# Patient Record
Sex: Female | Born: 1940 | Race: White | Hispanic: No | Smoking: Never smoker
Health system: Southern US, Community
[De-identification: ages and names within clinical notes are randomized; demographics above are authoritative.]

## PROBLEM LIST (undated history)

## (undated) DIAGNOSIS — R7303 Prediabetes: Secondary | ICD-10-CM

## (undated) DIAGNOSIS — C50919 Malignant neoplasm of unspecified site of unspecified female breast: Secondary | ICD-10-CM

## (undated) DIAGNOSIS — G709 Myoneural disorder, unspecified: Secondary | ICD-10-CM

## (undated) DIAGNOSIS — Z87442 Personal history of urinary calculi: Secondary | ICD-10-CM

## (undated) DIAGNOSIS — R112 Nausea with vomiting, unspecified: Secondary | ICD-10-CM

## (undated) DIAGNOSIS — T4145XA Adverse effect of unspecified anesthetic, initial encounter: Secondary | ICD-10-CM

## (undated) DIAGNOSIS — Z9889 Other specified postprocedural states: Secondary | ICD-10-CM

## (undated) DIAGNOSIS — C07 Malignant neoplasm of parotid gland: Secondary | ICD-10-CM

## (undated) DIAGNOSIS — N189 Chronic kidney disease, unspecified: Secondary | ICD-10-CM

## (undated) DIAGNOSIS — I10 Essential (primary) hypertension: Secondary | ICD-10-CM

## (undated) DIAGNOSIS — M1990 Unspecified osteoarthritis, unspecified site: Secondary | ICD-10-CM

## (undated) DIAGNOSIS — T8859XA Other complications of anesthesia, initial encounter: Secondary | ICD-10-CM

## (undated) HISTORY — PX: VESICOVAGINAL FISTULA CLOSURE W/ TAH: SUR271

## (undated) HISTORY — PX: TONSILLECTOMY: SUR1361

## (undated) HISTORY — DX: Malignant neoplasm of unspecified site of unspecified female breast: C50.919

## (undated) HISTORY — PX: OTHER SURGICAL HISTORY: SHX169

## (undated) HISTORY — PX: EYE SURGERY: SHX253

## (undated) HISTORY — DX: Essential (primary) hypertension: I10

---

## 1985-02-25 HISTORY — PX: DILATION AND CURETTAGE OF UTERUS: SHX78

## 1986-02-25 HISTORY — PX: BREAST LUMPECTOMY: SHX2

## 1989-02-25 HISTORY — PX: ABDOMINAL HYSTERECTOMY: SHX81

## 1996-02-26 HISTORY — PX: BREAST LUMPECTOMY: SHX2

## 1999-03-06 ENCOUNTER — Encounter: Payer: Self-pay | Admitting: Hematology and Oncology

## 1999-03-06 ENCOUNTER — Encounter: Admission: RE | Admit: 1999-03-06 | Discharge: 1999-03-06 | Payer: Self-pay | Admitting: Hematology and Oncology

## 2000-02-26 HISTORY — PX: MASTECTOMY: SHX3

## 2000-08-19 ENCOUNTER — Encounter: Payer: Self-pay | Admitting: General Surgery

## 2000-08-19 ENCOUNTER — Other Ambulatory Visit: Admission: RE | Admit: 2000-08-19 | Discharge: 2000-08-19 | Payer: Self-pay | Admitting: Internal Medicine

## 2000-08-19 ENCOUNTER — Encounter: Admission: RE | Admit: 2000-08-19 | Discharge: 2000-08-19 | Payer: Self-pay | Admitting: General Surgery

## 2000-08-20 ENCOUNTER — Other Ambulatory Visit: Admission: RE | Admit: 2000-08-20 | Discharge: 2000-08-20 | Payer: Self-pay | Admitting: General Surgery

## 2000-08-20 ENCOUNTER — Encounter (INDEPENDENT_AMBULATORY_CARE_PROVIDER_SITE_OTHER): Payer: Self-pay | Admitting: *Deleted

## 2000-08-20 ENCOUNTER — Encounter: Admission: RE | Admit: 2000-08-20 | Discharge: 2000-08-20 | Payer: Self-pay | Admitting: General Surgery

## 2000-08-20 ENCOUNTER — Encounter: Payer: Self-pay | Admitting: General Surgery

## 2000-09-15 ENCOUNTER — Encounter: Payer: Self-pay | Admitting: General Surgery

## 2000-09-17 ENCOUNTER — Inpatient Hospital Stay (HOSPITAL_COMMUNITY): Admission: RE | Admit: 2000-09-17 | Discharge: 2000-09-19 | Payer: Self-pay | Admitting: General Surgery

## 2000-09-17 ENCOUNTER — Encounter (INDEPENDENT_AMBULATORY_CARE_PROVIDER_SITE_OTHER): Payer: Self-pay | Admitting: *Deleted

## 2000-09-29 ENCOUNTER — Encounter: Payer: Self-pay | Admitting: Hematology and Oncology

## 2000-09-29 ENCOUNTER — Ambulatory Visit (HOSPITAL_COMMUNITY): Admission: RE | Admit: 2000-09-29 | Discharge: 2000-09-29 | Payer: Self-pay | Admitting: Hematology and Oncology

## 2000-10-29 ENCOUNTER — Ambulatory Visit (HOSPITAL_BASED_OUTPATIENT_CLINIC_OR_DEPARTMENT_OTHER): Admission: RE | Admit: 2000-10-29 | Discharge: 2000-10-29 | Payer: Self-pay | Admitting: General Surgery

## 2000-10-29 ENCOUNTER — Encounter: Payer: Self-pay | Admitting: General Surgery

## 2001-02-25 HISTORY — PX: MASTECTOMY: SHX3

## 2001-02-25 HISTORY — PX: HERNIA REPAIR: SHX51

## 2001-02-25 HISTORY — PX: BREAST RECONSTRUCTION: SHX9

## 2001-03-27 ENCOUNTER — Ambulatory Visit (HOSPITAL_BASED_OUTPATIENT_CLINIC_OR_DEPARTMENT_OTHER): Admission: RE | Admit: 2001-03-27 | Discharge: 2001-03-27 | Payer: Self-pay | Admitting: General Surgery

## 2001-04-01 ENCOUNTER — Ambulatory Visit: Admission: RE | Admit: 2001-04-01 | Discharge: 2001-06-30 | Payer: Self-pay | Admitting: Radiation Oncology

## 2001-06-22 ENCOUNTER — Encounter: Payer: Self-pay | Admitting: General Surgery

## 2001-06-22 ENCOUNTER — Encounter: Admission: RE | Admit: 2001-06-22 | Discharge: 2001-06-22 | Payer: Self-pay | Admitting: General Surgery

## 2001-07-10 ENCOUNTER — Encounter: Payer: Self-pay | Admitting: Plastic Surgery

## 2001-07-10 ENCOUNTER — Encounter (INDEPENDENT_AMBULATORY_CARE_PROVIDER_SITE_OTHER): Payer: Self-pay | Admitting: Specialist

## 2001-07-10 ENCOUNTER — Inpatient Hospital Stay (HOSPITAL_COMMUNITY): Admission: RE | Admit: 2001-07-10 | Discharge: 2001-07-13 | Payer: Self-pay | Admitting: Plastic Surgery

## 2001-10-30 ENCOUNTER — Ambulatory Visit (HOSPITAL_COMMUNITY): Admission: RE | Admit: 2001-10-30 | Discharge: 2001-10-30 | Payer: Self-pay | Admitting: *Deleted

## 2001-10-30 ENCOUNTER — Encounter: Payer: Self-pay | Admitting: *Deleted

## 2002-06-28 ENCOUNTER — Encounter: Admission: RE | Admit: 2002-06-28 | Discharge: 2002-06-28 | Payer: Self-pay | Admitting: *Deleted

## 2002-06-28 ENCOUNTER — Encounter: Payer: Self-pay | Admitting: *Deleted

## 2003-02-26 HISTORY — PX: BREAST LUMPECTOMY: SHX2

## 2003-07-14 ENCOUNTER — Encounter: Admission: RE | Admit: 2003-07-14 | Discharge: 2003-07-14 | Payer: Self-pay | Admitting: Oncology

## 2003-08-11 ENCOUNTER — Encounter: Admission: RE | Admit: 2003-08-11 | Discharge: 2003-08-11 | Payer: Self-pay | Admitting: Oncology

## 2003-08-11 ENCOUNTER — Encounter (INDEPENDENT_AMBULATORY_CARE_PROVIDER_SITE_OTHER): Payer: Self-pay | Admitting: *Deleted

## 2003-09-06 ENCOUNTER — Ambulatory Visit (HOSPITAL_BASED_OUTPATIENT_CLINIC_OR_DEPARTMENT_OTHER): Admission: RE | Admit: 2003-09-06 | Discharge: 2003-09-06 | Payer: Self-pay | Admitting: General Surgery

## 2003-09-06 ENCOUNTER — Ambulatory Visit (HOSPITAL_COMMUNITY): Admission: RE | Admit: 2003-09-06 | Discharge: 2003-09-06 | Payer: Self-pay | Admitting: General Surgery

## 2003-09-06 ENCOUNTER — Encounter (INDEPENDENT_AMBULATORY_CARE_PROVIDER_SITE_OTHER): Payer: Self-pay | Admitting: Specialist

## 2003-09-06 ENCOUNTER — Encounter: Admission: RE | Admit: 2003-09-06 | Discharge: 2003-09-06 | Payer: Self-pay | Admitting: General Surgery

## 2003-09-21 ENCOUNTER — Ambulatory Visit: Admission: RE | Admit: 2003-09-21 | Discharge: 2003-12-19 | Payer: Self-pay | Admitting: Radiation Oncology

## 2003-09-30 ENCOUNTER — Ambulatory Visit (HOSPITAL_COMMUNITY): Admission: RE | Admit: 2003-09-30 | Discharge: 2003-09-30 | Payer: Self-pay | Admitting: Oncology

## 2003-10-07 ENCOUNTER — Encounter: Admission: RE | Admit: 2003-10-07 | Discharge: 2003-10-07 | Payer: Self-pay | Admitting: Oncology

## 2003-10-14 ENCOUNTER — Encounter (HOSPITAL_COMMUNITY): Admission: RE | Admit: 2003-10-14 | Discharge: 2003-11-01 | Payer: Self-pay | Admitting: Oncology

## 2004-01-12 ENCOUNTER — Ambulatory Visit: Admission: RE | Admit: 2004-01-12 | Discharge: 2004-01-12 | Payer: Self-pay | Admitting: Radiation Oncology

## 2004-01-31 ENCOUNTER — Ambulatory Visit: Payer: Self-pay | Admitting: Oncology

## 2004-02-16 ENCOUNTER — Encounter: Admission: RE | Admit: 2004-02-16 | Discharge: 2004-02-23 | Payer: Self-pay | Admitting: Oncology

## 2004-08-14 ENCOUNTER — Encounter: Admission: RE | Admit: 2004-08-14 | Discharge: 2004-08-14 | Payer: Self-pay | Admitting: General Surgery

## 2004-08-21 ENCOUNTER — Ambulatory Visit: Payer: Self-pay | Admitting: Oncology

## 2004-11-02 ENCOUNTER — Encounter (INDEPENDENT_AMBULATORY_CARE_PROVIDER_SITE_OTHER): Payer: Self-pay | Admitting: Specialist

## 2004-11-02 ENCOUNTER — Ambulatory Visit (HOSPITAL_COMMUNITY): Admission: RE | Admit: 2004-11-02 | Discharge: 2004-11-02 | Payer: Self-pay | Admitting: Gastroenterology

## 2005-02-19 ENCOUNTER — Ambulatory Visit: Payer: Self-pay | Admitting: Oncology

## 2005-06-11 ENCOUNTER — Ambulatory Visit: Payer: Self-pay | Admitting: Oncology

## 2005-08-15 ENCOUNTER — Encounter: Admission: RE | Admit: 2005-08-15 | Discharge: 2005-08-15 | Payer: Self-pay | Admitting: Oncology

## 2005-08-26 ENCOUNTER — Encounter: Admission: RE | Admit: 2005-08-26 | Discharge: 2005-08-26 | Payer: Self-pay | Admitting: Oncology

## 2005-11-26 ENCOUNTER — Ambulatory Visit: Payer: Self-pay | Admitting: Oncology

## 2006-05-26 ENCOUNTER — Ambulatory Visit: Payer: Self-pay | Admitting: Oncology

## 2006-05-28 LAB — CBC WITH DIFFERENTIAL/PLATELET
Basophils Absolute: 0 10*3/uL (ref 0.0–0.1)
EOS%: 1.6 % (ref 0.0–7.0)
Eosinophils Absolute: 0.1 10*3/uL (ref 0.0–0.5)
HGB: 13.4 g/dL (ref 11.6–15.9)
LYMPH%: 24.5 % (ref 14.0–48.0)
MCH: 31.2 pg (ref 26.0–34.0)
MCV: 88.6 fL (ref 81.0–101.0)
MONO%: 5.7 % (ref 0.0–13.0)
NEUT#: 5.2 10*3/uL (ref 1.5–6.5)
Platelets: 337 10*3/uL (ref 145–400)
RBC: 4.28 10*6/uL (ref 3.70–5.32)
RDW: 10.9 % — ABNORMAL LOW (ref 11.3–14.5)

## 2006-05-28 LAB — COMPREHENSIVE METABOLIC PANEL
AST: 21 U/L (ref 0–37)
Alkaline Phosphatase: 91 U/L (ref 39–117)
BUN: 17 mg/dL (ref 6–23)
Glucose, Bld: 123 mg/dL — ABNORMAL HIGH (ref 70–99)
Total Bilirubin: 0.4 mg/dL (ref 0.3–1.2)

## 2006-08-18 ENCOUNTER — Encounter: Admission: RE | Admit: 2006-08-18 | Discharge: 2006-08-18 | Payer: Self-pay | Admitting: Oncology

## 2006-12-17 ENCOUNTER — Ambulatory Visit: Payer: Self-pay | Admitting: Oncology

## 2006-12-19 LAB — CBC WITH DIFFERENTIAL/PLATELET
Basophils Absolute: 0 10*3/uL (ref 0.0–0.1)
Eosinophils Absolute: 0.3 10*3/uL (ref 0.0–0.5)
HCT: 38.5 % (ref 34.8–46.6)
HGB: 13.6 g/dL (ref 11.6–15.9)
LYMPH%: 21.2 % (ref 14.0–48.0)
MCH: 31.2 pg (ref 26.0–34.0)
MCV: 88.5 fL (ref 81.0–101.0)
MONO#: 0.7 10*3/uL (ref 0.1–0.9)
MONO%: 6.5 % (ref 0.0–13.0)
NEUT#: 7 10*3/uL — ABNORMAL HIGH (ref 1.5–6.5)
NEUT%: 69.2 % (ref 39.6–76.8)
Platelets: 295 10*3/uL (ref 145–400)
RDW: 12.5 % (ref 11.3–14.5)

## 2006-12-19 LAB — COMPREHENSIVE METABOLIC PANEL
BUN: 16 mg/dL (ref 6–23)
CO2: 25 mEq/L (ref 19–32)
Calcium: 8.8 mg/dL (ref 8.4–10.5)
Chloride: 102 mEq/L (ref 96–112)
Creatinine, Ser: 0.82 mg/dL (ref 0.40–1.20)

## 2007-06-19 ENCOUNTER — Ambulatory Visit: Payer: Self-pay | Admitting: Oncology

## 2007-06-23 LAB — CBC WITH DIFFERENTIAL/PLATELET
EOS%: 1.6 % (ref 0.0–7.0)
LYMPH%: 28.4 % (ref 14.0–48.0)
MCH: 30.8 pg (ref 26.0–34.0)
MCHC: 34.7 g/dL (ref 32.0–36.0)
MCV: 88.8 fL (ref 81.0–101.0)
MONO%: 6.3 % (ref 0.0–13.0)
Platelets: 287 10*3/uL (ref 145–400)
RBC: 4.4 10*6/uL (ref 3.70–5.32)
RDW: 12.4 % (ref 11.3–14.5)

## 2007-06-23 LAB — COMPREHENSIVE METABOLIC PANEL
AST: 20 U/L (ref 0–37)
Albumin: 4.1 g/dL (ref 3.5–5.2)
Alkaline Phosphatase: 94 U/L (ref 39–117)
Glucose, Bld: 96 mg/dL (ref 70–99)
Potassium: 3.9 mEq/L (ref 3.5–5.3)
Sodium: 140 mEq/L (ref 135–145)
Total Bilirubin: 0.5 mg/dL (ref 0.3–1.2)
Total Protein: 7.3 g/dL (ref 6.0–8.3)

## 2007-08-20 ENCOUNTER — Encounter: Admission: RE | Admit: 2007-08-20 | Discharge: 2007-08-20 | Payer: Self-pay | Admitting: Oncology

## 2007-12-21 ENCOUNTER — Ambulatory Visit: Payer: Self-pay | Admitting: Oncology

## 2007-12-23 LAB — COMPREHENSIVE METABOLIC PANEL
ALT: 23 U/L (ref 0–35)
AST: 25 U/L (ref 0–37)
Albumin: 4.1 g/dL (ref 3.5–5.2)
Alkaline Phosphatase: 99 U/L (ref 39–117)
Glucose, Bld: 103 mg/dL — ABNORMAL HIGH (ref 70–99)
Potassium: 3.7 mEq/L (ref 3.5–5.3)
Sodium: 141 mEq/L (ref 135–145)
Total Bilirubin: 0.7 mg/dL (ref 0.3–1.2)
Total Protein: 7.1 g/dL (ref 6.0–8.3)

## 2007-12-23 LAB — CBC WITH DIFFERENTIAL/PLATELET
Basophils Absolute: 0 10*3/uL (ref 0.0–0.1)
EOS%: 1.8 % (ref 0.0–7.0)
HCT: 39.8 % (ref 34.8–46.6)
HGB: 13.7 g/dL (ref 11.6–15.9)
LYMPH%: 25.8 % (ref 14.0–48.0)
MCH: 30.8 pg (ref 26.0–34.0)
MCV: 89.8 fL (ref 81.0–101.0)
MONO%: 4.8 % (ref 0.0–13.0)
NEUT%: 67.3 % (ref 39.6–76.8)
Platelets: 265 10*3/uL (ref 145–400)
RDW: 12.6 % (ref 11.3–14.5)

## 2008-08-03 ENCOUNTER — Ambulatory Visit: Payer: Self-pay | Admitting: Oncology

## 2008-08-22 ENCOUNTER — Encounter: Admission: RE | Admit: 2008-08-22 | Discharge: 2008-08-22 | Payer: Self-pay | Admitting: Oncology

## 2009-02-01 ENCOUNTER — Ambulatory Visit: Payer: Self-pay | Admitting: Oncology

## 2009-02-03 LAB — CBC WITH DIFFERENTIAL/PLATELET
BASO%: 0.2 % (ref 0.0–2.0)
Eosinophils Absolute: 0.1 10*3/uL (ref 0.0–0.5)
HCT: 39 % (ref 34.8–46.6)
HGB: 13.4 g/dL (ref 11.6–15.9)
LYMPH%: 27.9 % (ref 14.0–49.7)
MCHC: 34.3 g/dL (ref 31.5–36.0)
MONO#: 0.4 10*3/uL (ref 0.1–0.9)
NEUT#: 5.4 10*3/uL (ref 1.5–6.5)
NEUT%: 65.3 % (ref 38.4–76.8)
Platelets: 287 10*3/uL (ref 145–400)
WBC: 8.3 10*3/uL (ref 3.9–10.3)
lymph#: 2.3 10*3/uL (ref 0.9–3.3)

## 2009-02-03 LAB — COMPREHENSIVE METABOLIC PANEL
ALT: 19 U/L (ref 0–35)
CO2: 22 mEq/L (ref 19–32)
Calcium: 9.1 mg/dL (ref 8.4–10.5)
Chloride: 104 mEq/L (ref 96–112)
Creatinine, Ser: 0.8 mg/dL (ref 0.40–1.20)
Glucose, Bld: 117 mg/dL — ABNORMAL HIGH (ref 70–99)
Sodium: 141 mEq/L (ref 135–145)
Total Bilirubin: 0.5 mg/dL (ref 0.3–1.2)
Total Protein: 7.3 g/dL (ref 6.0–8.3)

## 2009-09-21 ENCOUNTER — Encounter: Admission: RE | Admit: 2009-09-21 | Discharge: 2009-09-21 | Payer: Self-pay | Admitting: Oncology

## 2010-01-31 ENCOUNTER — Ambulatory Visit: Payer: Self-pay | Admitting: Oncology

## 2010-03-17 ENCOUNTER — Other Ambulatory Visit: Payer: Self-pay | Admitting: Oncology

## 2010-03-17 DIAGNOSIS — Z9889 Other specified postprocedural states: Secondary | ICD-10-CM

## 2010-03-17 DIAGNOSIS — Z9012 Acquired absence of left breast and nipple: Secondary | ICD-10-CM

## 2010-03-18 ENCOUNTER — Encounter: Payer: Self-pay | Admitting: Surgery

## 2010-03-19 ENCOUNTER — Encounter: Payer: Self-pay | Admitting: Oncology

## 2010-06-06 ENCOUNTER — Other Ambulatory Visit: Payer: Self-pay | Admitting: Orthopedic Surgery

## 2010-06-06 ENCOUNTER — Ambulatory Visit
Admission: RE | Admit: 2010-06-06 | Discharge: 2010-06-06 | Disposition: A | Discharge status: Home / Self Care | Payer: Medicare Other | Source: Ambulatory Visit | Attending: Orthopedic Surgery | Admitting: Orthopedic Surgery

## 2010-06-07 ENCOUNTER — Ambulatory Visit (INDEPENDENT_AMBULATORY_CARE_PROVIDER_SITE_OTHER): Payer: Medicare Other | Admitting: Internal Medicine

## 2010-06-07 ENCOUNTER — Encounter: Payer: Self-pay | Admitting: Internal Medicine

## 2010-06-07 DIAGNOSIS — J9383 Other pneumothorax: Secondary | ICD-10-CM

## 2010-06-07 DIAGNOSIS — J939 Pneumothorax, unspecified: Secondary | ICD-10-CM

## 2010-06-07 DIAGNOSIS — I10 Essential (primary) hypertension: Secondary | ICD-10-CM

## 2010-06-07 NOTE — Assessment & Plan Note (Signed)
Discussed in detail all the  indications, usual  risks and alternatives  relative to the benefits with patient who agrees to proceed with conservative f/u in one week with cxr and clear for cxr if no sign ptx  - the assoc small effusion is undoubtedly small hemothorax so unless develops a post cardiac injury syncrome (Dressler's like) should do fine without intervention
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
## 2010-06-07 NOTE — Patient Instructions (Addendum)
Return for f/u cxr on Wednesday 06/13/10 - call sooner if get worse or go to ER

## 2010-06-12 ENCOUNTER — Encounter: Payer: Self-pay | Admitting: Internal Medicine

## 2010-06-13 ENCOUNTER — Ambulatory Visit (INDEPENDENT_AMBULATORY_CARE_PROVIDER_SITE_OTHER): Payer: Medicare Other | Admitting: Internal Medicine

## 2010-06-13 ENCOUNTER — Ambulatory Visit (INDEPENDENT_AMBULATORY_CARE_PROVIDER_SITE_OTHER)
Admission: RE | Admit: 2010-06-13 | Discharge: 2010-06-13 | Disposition: A | Discharge status: Home / Self Care | Payer: Medicare Other | Source: Ambulatory Visit | Attending: Internal Medicine | Admitting: Internal Medicine

## 2010-06-13 ENCOUNTER — Encounter: Payer: Self-pay | Admitting: Internal Medicine

## 2010-06-13 ENCOUNTER — Telehealth: Payer: Self-pay | Admitting: Internal Medicine

## 2010-06-13 VITALS — BP 116/72 | HR 75 | Temp 97.9°F | Ht 66.0 in | Wt 216.4 lb

## 2010-06-13 DIAGNOSIS — J939 Pneumothorax, unspecified: Secondary | ICD-10-CM

## 2010-06-13 DIAGNOSIS — J9383 Other pneumothorax: Secondary | ICD-10-CM

## 2010-06-13 NOTE — Telephone Encounter (Signed)
Faxed OV & CXR to [facility] ([phone]).

## 2010-06-13 NOTE — Telephone Encounter (Signed)
Will forward to Dr. Sherene Sires since pt saw him today at 12:00 as new consult. Please advise Dr. Sherene Sires. Thanks

## 2010-06-13 NOTE — Patient Instructions (Signed)
You are cleared for surgery from a pulmonary perspective and we are available if needed  Anesthesia should be aware of the recent lung injury so as to avoid high airway pressures.
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
## 2010-06-13 NOTE — Assessment & Plan Note (Signed)
    -   CXR 06/13/2010 > resolved ptx, very small effusion  She has a very small residual L effusion of no consequence likley old blood but no longer a contraindication to surgery as long as use low pressures if requires intubation for L hand surgery  Notified Dr Amanda Pea

## 2010-06-25 ENCOUNTER — Encounter (INDEPENDENT_AMBULATORY_CARE_PROVIDER_SITE_OTHER): Payer: Self-pay | Admitting: General Surgery

## 2010-07-13 NOTE — H&P (Signed)
[place]  Patient:    Jenna Jordan, Jenna Jordan                          MRN: [id number] Proc. Date: 09/17/00 Adm. Date:  09/17/00 Attending:  Rose Phi. Maple Hudson, M.D.                         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  She received chemotherapy and postop radiation therapy and has done well in followup since then until she had a recent routine mammogram which showed a new mass, spiculated, at the 12 oclock position.  Needle core biopsy showed an invasive carcinoma.  She is now scheduled for a completion left mastectomy and a delayed TRAM reconstruction.  PAST SURGICAL HISTORY:  Previous surgery consists primarily of the surgery for her breast cancer as noted above in 1989.  ALLERGIES:  She is allergic to DEMEROL and PENICILLIN.  FAMILY HISTORY:  There is no family history of breast cancer.  REVIEW OF SYSTEMS:  Review of systems showed no significant cardiorespiratory, gastrointestinal or genitourinary problems except as noted above.  PHYSICAL FINDINGS:  VITAL SIGNS:  Blood pressure 156/80, pulse 80, respirations 16.  HEENT:  Within normal limits.  NECK:  Supple without masses.  There was no palpable adenopathy.  BREASTS:  The right breast was totally normal.  The left breast had a little thickened area at the 12 oclock position.  No palpable adenopathy.  HEART:  Normal sinus rhythm without murmurs.  LUNGS:  Clear to auscultation.  ABDOMEN:  Soft without masses, tenderness or organomegaly.  Bowel sounds were active.  There were no herniae.  PELVIC AND RECTAL:  Exams have been previously done.  EXTREMITIES:  No abnormalities.  IMPRESSION:  Recurrent or new carcinoma of the left breast. DD:  09/17/00 TD:  09/17/00 Job:  [id number]

## 2010-07-13 NOTE — Op Note (Signed)
NAME:  Jenna Jordan, Jenna Jordan                           ACCOUNT NO.:  [id number]   MEDICAL RECORD NO.:  [id number]                   PATIENT TYPE:  AMB   LOCATION:  DSC                                  FACILITY:  [facility]   PHYSICIAN:  Rose Phi. Maple Hudson, M.D.                DATE OF BIRTH:  [birth year]   DATE OF PROCEDURE:  09/06/2003  DATE OF DISCHARGE:                                 OPERATIVE REPORT   PREOPERATIVE DIAGNOSIS:  Stage I carcinoma of the right breast.   POSTOPERATIVE DIAGNOSIS:  Stage I carcinoma of the right breast.   OPERATION:  1. Blue dye injection.  2. Right partial mastectomy with needle localization and specimen mammogram.  3. Right sentinel lymph node biopsy.   SURGEON:  Rose Phi. Maple Hudson, M.D.   ANESTHESIA:  General.   OPERATIVE PROCEDURE:  Prior to coming to the operating room, a localizing  wire had been placed with the lesion at about the 12 o'clock position of the  right breast.  In addition, 1 millicurie of technetium sulfur colloid was  injected intradermally.   After suitable general anesthesia was induced, the patient was placed in a  supine position with the arms extended on the arm board.  A mixture of 2 mL  of methylene blue and 3 mL of injectable saline were then injected in the  subareolar tissue and the breast gently massaged for about three minutes.  After prepping and draping, a curved incision centered on the lesion at the  12 o'clock position was then made and we delivered the wire into the  incision and then widely excised the wire and surrounding tissue.  The  specimen was oriented for the pathologist.  Specimen mammogram confirmed the  removal of the lesion and touch preps on the margin were negative.   While that was being done, a short transverse right axillary incision was  made with dissection through the subcutaneous tissue to the clavipectoral  fascia.  There was one clear blue hot lymph node which I excised.  There  were no other palpable blue  or hot nodes.  That lymph node was submitted as  a sentinel node and it turned out to be negative for metastatic disease.  We  injected 0.25% Marcaine in both incisions and then closed them in two layers  with 3-0 Vicryl and subcuticular 4-0 Monocryl and Steri-Strips.   After receiving the reports from the pathologist, dressings were applied and  the patient transferred to the recovery room in satisfactory condition,  having tolerated the procedure well.                                               Rose Phi. Maple Hudson, M.D.    PRY/MEDQ  D:  09/06/2003  T:  09/06/2003  Job:  [id number]

## 2010-07-13 NOTE — H&P (Signed)
[place]. [facility]  Patient:    Jenna Jordan, Jenna Jordan Visit Number: [id number] MRN: [id number]          Service Type: SUR Location: [id number] Attending Physician:  Chapman Moss Dictated by:   Teena Irani. Odis Luster, M.D. Admit Date:  07/10/2001                           History and Physical  CHIEF COMPLAINT:  Left breast cancer with surgical absence of left breast.  HISTORY OF PRESENT ILLNESS:  [age]-year-old woman had breast cancer back in 1988.  She underwent lumpectomy and radiation therapy.  This failed last year; she had a recurrent breast cancer.  She underwent a total mastectomy and had a delay of a TRAM flap at that time, since she was very interested in breast reconstruction.  Autogenous tissue was preferred, since she had had previous radiation.  She now presents for the transfer of the TRAM flap and breast reconstruction.  PAST MEDICAL HISTORY:  Negative for cardiac, renal, GI, pulmonary disease, diabetes mellitus or hypertension.  REVIEW OF SYSTEMS:  Entirely negative.  SOCIAL HISTORY:  She lives locally.  She works as a Engineer, civil (consulting).  She is a nonsmoker.  PHYSICAL EXAMINATION:  GENERAL:  Slightly obese woman in no acute distress, alert and cooperative.  HEENT:  PERL.  EOMI.  NECK:  Supple without adenopathy or mass.  LUNGS:  Clear to auscultation.  Breath sounds equally bilaterally.  HEART:  Regular rate and rhythm without murmur.  S1 and S2 within normal limits.  BREASTS:  Right breast without mass.  There is no axillary adenopathy on either side.  Left chest scar is well-healed and there is no evidence of any tenderness or mass.  ABDOMEN:  Slightly obese.  Well-healed delayed scar in the lower abdomen oriented transversely.  EXTREMITIES:  Full range of motion and equal strength bilaterally.  NEUROLOGIC:  Grossly intact.  IMPRESSION:  Left breast cancer with surgical absence of left breast.  PLAN:  The plan is a left  breast reconstruction using a contralateral TRAM flap. Dictated by:   Teena Irani. Odis Luster, M.D. Attending Physician:  Chapman Moss DD:  07/10/01 TD:  07/11/01 Job: [id number]

## 2010-07-13 NOTE — Op Note (Signed)
NAME:  Jenna Jordan, Jenna Jordan                 ACCOUNT NO.:  [id number]   MEDICAL RECORD NO.:  [id number]          PATIENT TYPE:  AMB   LOCATION:  ENDO                         FACILITY:  [facility]   PHYSICIAN:  Bernette Redbird, M.D.   DATE OF BIRTH:  [birth year]   DATE OF PROCEDURE:  11/02/2004  DATE OF DISCHARGE:                                 OPERATIVE REPORT   PROCEDURE:  Colonoscopy with polypectomy.   ENDOSCOPIST:  Bernette Redbird, M.D.   INDICATIONS:  Colon cancer screening without worrisome risk factors or  symptoms.   FINDINGS:  Multiple polyps.   PROCEDURE:  The nature, purpose, risks the procedure been discussed with the  patient who provided written consent.  Sedation prior to and during the  course of this lengthy somewhat difficult examination was fentanyl 112.5 mcg  and Versed 11 mg.  There was no problem with clinical instability during the  course of the procedure.   We used the Olympus adjustable tension pediatric video colonoscope.  The  sigmoid region was quite angulated and fixated, especially near the  rectosigmoid junction.  I was able to advance the scope around to the area  above the cecum about one haustration above the cecum, but could not advance  it more proximally.  We turned the patient into the supine position and even  the right lateral decubitus position, then back to supine and left lateral  decubitus positions, using next external abdominal compression in multiple  locations, but we could not seem to get the scope to advance to the base of  the cecum. The ileocecal valve was clearly defined, and I was able to see  the floor of the cecum fairly well, and I used a biopsy forceps to tent up  mucosa allowing me to see even more of the side walls of the cecum.  Thus,  it is not felt likely that any significant lesions would been missed in that  area.  Pullback was then initiated around the colon.  In the proximal  ascending colon were too small sessile polyps  measuring about 4 mm across,  removed by hot snare technique.  I encountered further polyps in the  rectosigmoid area, the largest being about 1.5 cm across,  although I did  manage to suction through the scope with some resistance.  It was removed by  hot snare technique with complete hemostasis and no evidence of excessive  cautery.  Several smaller hyperplastic-appearing sessile polyps measuring  about 5-7 mm across were removed by snare technique and suctioned through  the scope.  Retroflexion in the rectum was normal.   No large polyps, cancer, colitis, vascular malformations or diverticulosis  were observed on this exam.   The patient tolerated the procedure well and there no apparent  complications.   IMPRESSION:  Multiple colon and rectal polyps removed as described above  (211.3).   PLAN:  Await pathology results with consideration for colonoscopic followup  in 2-3 years depending on the histologic findings, in view of the multiple  polyps encountered.           ______________________________  Bernette Redbird, M.D.     RB/MEDQ  D:  11/02/2004  T:  11/02/2004  Job:  [id number]   cc:   Candyce Churn. Allyne Gee, M.D.  [address]  [address]  [place]  [place] [address]  Fax: [phone]

## 2010-07-13 NOTE — Op Note (Signed)
[place]. [facility]  Patient:    Jenna Jordan, Jenna Jordan Visit Number: [id number] MRN: [id number]          Service Type: SUR Location: [id number] Attending Physician:  Chapman Moss Dictated by:   Teena Irani. Odis Luster, M.D. Proc. 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  POSTOPERATIVE DIAGNOSIS: 1. 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 2. Incarcerated paramedian rectus hernia, right side. 3. Paraumbilical hernia. 4. Umbilical hernia.  OPERATION PERFORMED: 1. Transverse rectus abdominis myocutaneous flap breast reconstruction, left    breast ipsilateral rectus muscle. 2. Repair of incarcerated right paramedian rectus muscle hernia using mesh. 3. Repair of a paraumbilical hernia using mesh. 4. Repair of an umbilical hernia.  SURGEON:  Teena Irani. Odis Luster, M.D.  ASSISTANT:  Alfredia Ferguson, M.D.  ANESTHESIA:  General.  ESTIMATED BLOOD LOSS:  225 cc.  DRAINS:  Five Blakes, three in the abdomen, two in the chest.  INDICATIONS FOR PROCEDURE:  This 70 year old woman has undergone a lumpectomy with radiation therapy back in 1988.  She subsequently experienced a recurrence of her breast cancer last year and underwent a left total mastectomy followed by delay of the TRAM flap.  She now presents for the transfer of the TRAM flap.  The nature of the procedure as well as the risks and possible complications were discussed with her in detail including the consent form which was discussed with her again as it had been discussed prior to the TRAM delay procedure.  She understood these risks and possible complications and wished to proceed.  DESCRIPTION OF PROCEDURE:  The patient was taken to the operating room and placed supine.  She has been marked in the full upright position the  day prior.  She was prepped with Betadine and draped with sterile drapes.  The left chest defect was opened incising the old scar to release the scar tissue there and then developing the pocket both superiorly and inferiorly using electrocautery.  Meticulous hemostasis was achieved with electrocautery.  Two Blake drains were positioned and brought out through separate stab wounds inferolaterally and secured with 3-0 Prolene sutures.  Incision was made around the umbilicus and dissection carried down through the deep subcutaneous tissues using the Metzenbaum scissor.  A generous fatty stalk was maintained around the umbilicus in order to ensure survival of the umbilicus.  The incisions were then made as has been marked preoperatively. The dissection was carried out in the cephalad direction and as this was done, a mass was encountered at the midrectus on the right side.  This was investigated.  It was dissected around the periphery of the rectus muscle fascia and it was felt to be a hernia.  The dissection was continued, freeing this hernia and it was clearly a hernia containing mesentery.  The hernia sac was opened. Dissection was continued through the anterior sheath, then deep to the muscle then back to the posterior sheath.  Adhesions were taken down and this incarcerated hernia was released.  The omentum having been freed, meticulous hemostasis with electrocautery and after checking to make sure there was no bleeding, the omentum was returned to the abdomen.  The fascial closure with 0 Prolene simple interrupted sutures.  This closure incorporated the anterior and posterior sheath in the closure as well as a little bit of the rectus muscle.  Great care was taken to avoid trapping any bowel or mesentery in these sutures.  They were all placed and then once they were all in position, they were tied securely.  The dissection was then continued cephalad and the subcutaneous tunnel was then  connected from the abdomen up to the left chest pocket.  Because of this dissection around this hernia, it was felt that it was possible that the blood supply through the rectus muscle had been damaged and it would be very difficult elevating even the right rectus muscle out of its bed since the hernia repair had incorporated anterior and posterior sheaths.  It was felt instead that the flap would be based ipsilateral.  The flap was raised over to the midline using electrocautery. The flap was raised from the left side over to the lateral row of perforators using electrocautery.  Parallel fascial incisions were made in the anterior fascia leaving a 2 cm wide strip on the rectus muscle on the left side.  The rectus muscle was then gently dissected out of its fascial attachments using a combination of the scalpel at tendinous inscriptions as well as bipolar cautery.  Once the muscle had been freed and the intercostal perforators laterally had been taken down using Ligaclips, the rectus muscle was mobilized inferiorly and was divided.  The previously clipped deep inferior epigastric vessels were clipped again at the time of this division.  All of zone 4 was amputated and a small portion of zone 3.  The left chest pocket was thoroughly irrigated with saline.  Meticulous hemostasis was achieved with electrocautery.  The flap was passed through the subcutaneous tunnel out into the left chest temporarily secured with skin staples.  During the course of dissecting the TRAM flap free, a paraumbilical hernia had been identified and great care was taken to avoid damage to the omentum that was trapped in that hernia as well.  The hernia was returned to the abdomen.  No bleeding noted. Again, a careful closure with 0 Prolene simple interrupted sutures.  Thorough irrigation with saline.  Meticulous hemostasis was achieved with electrocautery.  The anterior fascial closure 0 Prolene  interrupted figure-of-eight sutures, great care being taken to avoid damage to the underlying intra-abdominal contents.  The anterior fascia having been closed,  the umbilicus stalk was noted to be quite bulky and dissection there at the inferior aspect of the umbilicus revealed that this also was an umbilical hernia with preperitoneal fat.  This fat was freed, returned to the abdomen and the umbilical hernia was closed under direct vision, again using 0 Prolene simple interrupted sutures, great care again being taken to avoid any damage to the underlying intra-abdominal contents.  Sutures were preplaced and then tied securely avoiding any entrapment of underlying fat or testicle. Thorough irrigation again with saline.  A large piece of mesh was then positioned and this was positioned in such a way as to include the suture line along the anterior fascia on the left side as well as the paraumbilical hernia and the right paramedian hernia.  This achieved a mesh reinforcement of all hernia repairs as well as the TRAM donor site.  The mesh was secured around its periphery using  2-0 Prolene running sutures with an occasional interrupted suture to place the mesh on the proper tension.  The umbilicus was brought through the mesh at the  appropriate point.  Thorough irrigation again with saline.  Excellent hemostasis having been confirmed, the Blake drains were positioned.  A total of three were brought out through separate stab wounds inferiorly and secured with 3-0 Prolene sutures.  The abdominal closure with 2-0 Vicryl interrupted inverted deep sutures.  The umbilicus was brought up as close to the midline as possible although it had pulled somewhat over to the left.  It was not possible to bring it back completely to the midline which had been marked preoperatively.  Umbilicus insetting 3-0 Vicryl  interrupted inverted deep sutures and 3-0 Prolene simple interrupted sutures.  The umbilicus  appeared to have very good color and appeared to be viable.  Skin edges of the abdominal closure also very healthy.  Attention was then directed to the left chest.  The flap was inspected and was found to have excellent color.  There was a little more of zone 3 trimmed away and a tiny bit of zone 2 and there was nice bright red bleeding at these sites consistent with viability.  Thorough irrigation with saline.  The flap inferior border was then set with 3-0 Vicryl interrupted inverted deep sutures, then a running 3-0 Vicryl subcuticular suture as needed.  The flap was then marked for de-epithelialization superiorly.  The flap was de-epithelialized and then flap insetting with suspension sutures, 3-0 Vicryl interrupted horizontal mattress sutures to the upper border of the pocket thus suspending the flap at that level.  The remainder of the closure 3-0 Vicryl running subcuticular suture for the superior aspect of the skin paddle.  The flap continued to have excellent color, excellent capillary refill and had already demonstrated bright red bleeding around all the peripheral edges consistent with viability.  Drains were placed on suction, Steri-Strips were applied.  Dry sterile dressing lightly applied and she was transported to the recovery room in stable condition having tolerated the procedure well.  The extensive operating time of almost six hours was due to the three hernias that were identified during surgery including an incarcerated hernia as separate distinct procedures. Dictated by:   Teena Irani. Odis Luster, M.D. Attending Physician:  Chapman Moss DD:  07/10/01 TD:  07/13/01 Job: [id number]

## 2010-07-13 NOTE — Discharge Summary (Signed)
[place]. [facility]  Patient:    Jenna Jordan, Jenna Jordan Visit Number: [id number] MRN: [id number]          Service Type: SUR Location: [id number] Attending Physician:  Chapman Moss Dictated by:   Teena Irani. Odis Luster, M.D. Admit Date:  07/10/2001 Discharge Date: 07/13/2001                             Discharge Summary  FINAL DIAGNOSES 1. Left breast cancer, acquired absence left breast. 2. Right paramedian ventral incisional hernia. 3. Paraumbical hernia. 4. Umbilical hernia.  PROCEDURES PERFORMED: 1. Ipsilateral based tram flap breast reconstruction, left breast. 2. Repair paramedian hernia right side of abdomen incisional hernia. 3. Repair of umbilical hernia. 4. Repair of paraumbilical hernia.  HISTORY OF PRESENT ILLNESS: The patient is a 70 year old woman who had a mastectomy last year after failure of lumpectomy and radiation therapy with axillary dissection back in 1998. At that time she underwent a delay procedure for her tram flap breast reconstruction. She underwent chemotherapy and did well through that and now presents for transfer of the flap. The nature of the procedure and the risks were well understood by her and she wished to proceed. For details in the history and physical, please see the chart.  HOSPITAL COURSE: On the day of admission she was taken to surgery at which time the tram flap was performed. In the course of performing the tram flap, multiple hernias were found from a previous laparoscopic surgery. These were repaired very carefully. She also had the tram flap at that time. She tolerated this procedure extremely well.  Postoperatively she did well. She had a low-grade fever the first night, which resolved and responded to ambulation and incentive spirometry. She has remained afebrile. The flap has maintained excellent color and capillary refill at one to two seconds. Abdominal closure looks very good. Hemoglobin 11,  hematocrit greater than 31 on the first postoperative day. She is tolerating clear liquids and it is felt that she will be ready for discharge.  DISCHARGE INSTRUCTIONS:  She will be discharged on a regular diet. She will not shower until the drains are out. She will look at the drainage three times a day and record. No lifting, no vigorous activity, no exercising.  FOLLOW UP:  We will see her back in the office in a week. I would like her to call towards the end of the week and report the drainage, since some of the drains may be able to come out before the weekend.  DISCHARGE MEDICATIONS: She will be on Tylenol extra strength for pain and Keflex 5 mg p.o. q.i.d. Dictated by:   Teena Irani. Odis Luster, M.D. Attending Physician:  Chapman Moss DD:  07/12/01 TD:  07/14/01 Job: [id number]

## 2010-07-13 NOTE — Op Note (Signed)
[place]. [facility]  Patient:    Jenna Jordan, Jenna Jordan Visit Number: [id number] MRN: [id number]          Service Type: DSU Location: [facility] Attending Physician:  Janalyn Rouse Proc. Date: 10/29/00 Admit Date:  10/29/2000                             Operative Report  PREOPERATIVE DIAGNOSIS:  Carcinoma of the left breast.  POSTOPERATIVE DIAGNOSIS:  Carcinoma of the left breast.  PROCEDURE:  Insertion of a Port-A-Cath.  SURGEON:  Rose Phi. Maple Hudson, M.D.  ANESTHESIA:  MAC.  DESCRIPTION OF PROCEDURE:  The patient was placed on the operating room table and the right upper chest and neck prepped and draped in the usual fashion. Under local anesthesia, a right subclavian puncture was carried out without difficulty and a guidewire inserted and proper positioning confirmed by fluoroscopy.  We then made a transverse incision in the old Port-A-Cath site in the right anterior chest wall and developed a pocket for the implantable port.  We then tunnelled between the subclavian site and then the newly developed pocket and attached a pre-attached catheter and then anchored the port in the pocket with two, 2-0 Prolene sutures.  The catheter tip was trimmed to the proper length and then a dilator and peel-away sheath passed over the wire and the wire removed followed by the dilator and then the catheter passed through the peel-away sheath which was then removed.  Proper positioning of the catheter tip as well as there was no kinking was then confirmed by fluoroscopy.  The incision was closed with 4-0 Monacryl ______ and the left acess being fully heparinized.  Dressing applied, and the patient transferred to the recovery room in satisfactory condition having tolerated the procedure well. Attending Physician:  Janalyn Rouse DD:  10/29/00 TD:  10/29/00 Job: [id number]

## 2010-07-13 NOTE — Discharge Summary (Signed)
[place]. [facility]  Patient:    Jenna Jordan, Jenna Jordan                        MRN: [id number] Adm. Date:  [id number] Disc. Date: [id number] Attending:  Janalyn Rouse                           Discharge Summary  DISCHARGE DIAGNOSIS:  Left breast cancer.  PROCEDURES: 1. Left total mastectomy. 2. Delay of a transverse rectus abdominis myocutaneous flap breast    reconstruction.  HISTORY OF PRESENT ILLNESS:  This 70 year old woman had a lumpectomy and radiation therapy of left chest for a breast cancer about 16 or 17 years ago. She had done well but had a relapse and recurrence in the left breast on mammography.  Total mastectomy was scheduled and she was very interested in reconstruction.  Since she has had previous radiation, tissue expansion was not really an option nor did she want any prosthetic device.  Therefore, the TRAM flap was discussed with her but it was felt that it would be safer to delay the TRAM flap.  This was planned at the time of the mastectomy.  She understood all this and wished to proceed.  For further details of the history and physical, please see the chart.  HOSPITAL COURSE:  She was taken to surgery.  At which time, the total mastectomy and the delay of the TRAM flap were performed.  She tolerated these procedures well.  Postoperatively, she did well.  Low grade fever responding to incentive spirometry.  Vital signs remained stable.  Drains have decreased.  The abdominal drain at the delay of the TRAM flap site is draining just negligible amounts now.  It is removed on the day of discharge.  The abdominal wound looks very healthy.  There is no evidence of any infection.  She is ready for discharge.  DISPOSITION:  She is discharged on regular diet.  DISCHARGE MEDICATIONS: 1. Keflex 500 mg p.o. q.i.d. 2. Percocet 5 mg tablet total of 40 given 1-2 p.o. q.4h. for pain.  DISCHARGE INSTRUCTIONS:  She will empty her drain at the  mastectomy site three times a day and record the amount.  She will follow up with Dr. Maple Hudson on Tuesday of next week and with Dr. Teena Irani. Odis Luster late next week.  No lifting. No exercising and no shower yet.  No driving as long as she is taking any pain medicine.  During the day, she will use her spirometer every hour while awake at home. DD:  09/19/00 TD:  09/20/00 Job: [id number]

## 2010-07-13 NOTE — Op Note (Signed)
[place]. [facility]  Patient:    Jenna Jordan, NEEDS                       MRN: [id number] Proc. Date: 09/17/00 Adm. Date:  09/17/00 Attending:  Teena Irani. Odis Luster, M.D.                           Operative Report  PREOPERATIVE DIAGNOSIS:  Left breast cancer.  POSTOPERATIVE DIAGNOSIS:  Left breast cancer.  PROCEDURE:  Delay procedure of a transverse rectus abdominis myocutaneous flap.  SURGEON:  Teena Irani. Odis Luster, M.D.  ANESTHESIA:  General.  ESTIMATED BLOOD LOSS:  Minimal.  DRAINS:  1 blake in the abdominal wound.  CLINICAL NOTE:  A 70 year old woman has left breast cancer.  This is a recurrent after a lumpectomy and radiation therapy approximately 16 to 17 years ago.  She presents for total mastectomy.  She was interested in reconstruction.  The various options were discussed with her.  It was felt that tissue expansion was not a good option since she had had previous radiation.  She preferred autogenous tissue.  She elected to trans flap.  Due to her overall body weight it was felt that it would be safer if the trans flap were delayed.  This was discussed with her and the reasoning behind the staged procedure of this nature.  She understood this and the risks and the possible complications and wishes to proceed.  PROCEDURE IN DETAIL:  With the mastectomy being completed, the incision was then made in the crease, below the panniculus.  The dissection was carried down through subcutaneous tissue using electrocautery.  Meticulous hemostasis maintained using electrocautery.  Underlying fascia was identified.  A small incision made bilaterally at the lateral aspect of the rectus muscles through the fascia.  In a general  spreading fashion the deep inferior epigastric vessels were identified bilaterally.  The arteries were triple ligated proximal and distal and the veins double ligated proximal and distal and they were divided.  A thorough irrigation with  saline and meticulous hemostasis having been achieved using electrocautery.  The closure of the fascia with 0 Prolene interrupted figure-of-eight sutures with great care being taken to avoid any damage to underlying intra-abdominal contents.  Having completed the fascia closure, thorough irrigation with saline.  The blake drain was positioned in the wound and brought out through the lateral aspect of the wound and secured with a 3-0 Prolene suture.  The skin closure with 2-0 Vicryl interrupted inverted deep sutures and a running 3-0 monacryl subcuticular suture.  Steri-Strips and dry sterile dressing were applied and she tolerated the procedure well. DD:  09/17/00 TD:  09/17/00 Job: [id number]

## 2010-07-13 NOTE — Op Note (Signed)
[place]. [facility]  Patient:    Jenna Jordan, Jenna Jordan                          MRN: [id number] Proc. Date: 09/17/00 Attending:  Rose Phi. Young, M.D. CC:         Lennis P. Darrold Span, M.D.   Operative Report  PREOPERATIVE DIAGNOSIS:  Recurrent carcinoma of the left breast.  POSTOPERATIVE DIAGNOSIS:  Recurrent carcinoma of the left breast.  OPERATION PERFORMED: 1. Left completion total mastectomy. 2. Delayed transverse rectus abdominis myocutaneous flap reconstruction.  SURGEON:  Rose Phi. Maple Hudson, M.D.  ANESTHESIA:  General.  DESCRIPTION OF PROCEDURE:  After suitable general endotracheal anesthesia was induced, the patient was placed in the supine position with the left arm extended on the arm board.  We then prepped her from the pubis to neck and from side of the table to side of the table exposing the abdomen and both breasts.  A transverse elliptical incision had been outlined to do the left total mastectomy.  The incisions were then made and the the flaps dissected in the standard fashion using the cautery going medially to the sternum and superiorly to the level of the clavicle to have a margin of breast tissue and then laterally to the latissimus dorsi muscle and then inferiorly to the inframammary fold at the rectus fascia.  The breast was then removed by dissecting from medial to lateral incorporating the pectoralis fascia and getting the low axilla as we took out the tissue lateral to the pectoralis major muscle.  Following removal of the breast, hemostasis was obtained with the cautery.  A 19 [place] Blake drain was inserted and brought out through a separate stab wound.  The subcuticular tissue was closed with 3-0 Vicryl and the skin with staples.  Dr. Odis Luster was then going to do a delayed donor TRAM and that will be dictated in a separate note. DD:  09/17/00 TD:  09/17/00 Job: [id number]

## 2010-07-27 HISTORY — PX: HAND SURGERY: SHX662

## 2010-09-24 ENCOUNTER — Ambulatory Visit
Admission: RE | Admit: 2010-09-24 | Discharge: 2010-09-24 | Disposition: A | Discharge status: Home / Self Care | Payer: Medicare Other | Source: Ambulatory Visit | Attending: Oncology | Admitting: Oncology

## 2010-09-24 DIAGNOSIS — Z9889 Other specified postprocedural states: Secondary | ICD-10-CM

## 2010-09-24 DIAGNOSIS — Z9012 Acquired absence of left breast and nipple: Secondary | ICD-10-CM

## 2010-11-28 ENCOUNTER — Encounter (INDEPENDENT_AMBULATORY_CARE_PROVIDER_SITE_OTHER): Payer: Self-pay | Admitting: General Surgery

## 2010-11-28 ENCOUNTER — Ambulatory Visit (INDEPENDENT_AMBULATORY_CARE_PROVIDER_SITE_OTHER): Payer: Medicare Other | Admitting: General Surgery

## 2010-11-28 VITALS — BP 120/86 | HR 80 | Temp 97.4°F | Resp 18 | Ht 66.0 in | Wt 219.5 lb

## 2010-11-28 DIAGNOSIS — Z853 Personal history of malignant neoplasm of breast: Secondary | ICD-10-CM

## 2010-11-28 NOTE — Patient Instructions (Signed)
No evidence of problems at this time mammogram was negative , repeat mammogram next summer, see either Dr. Johna Sheriff or Dr. Daphine Deutscher in one year
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
## 2011-02-22 ENCOUNTER — Other Ambulatory Visit: Payer: Medicare Other | Admitting: Lab

## 2011-02-22 ENCOUNTER — Ambulatory Visit (HOSPITAL_BASED_OUTPATIENT_CLINIC_OR_DEPARTMENT_OTHER): Payer: Medicare Other | Admitting: Oncology

## 2011-02-22 VITALS — BP 128/81 | HR 76 | Temp 98.0°F | Ht 66.0 in | Wt 223.0 lb

## 2011-02-22 DIAGNOSIS — Z87898 Personal history of other specified conditions: Secondary | ICD-10-CM

## 2011-02-22 DIAGNOSIS — Z853 Personal history of malignant neoplasm of breast: Secondary | ICD-10-CM

## 2011-02-22 DIAGNOSIS — C50919 Malignant neoplasm of unspecified site of unspecified female breast: Secondary | ICD-10-CM

## 2011-02-23 NOTE — Progress Notes (Signed)
OFFICE PROGRESS NOTE Date of Visit  02-22-2011 Physicians:  K.Renae Gloss, W.Weatherly  INTERVAL HISTORY:   Patient is seen, alone for visit today, in scheduled yearly follow up of her history of bilateral breast carcinoma. The most recent breast cancer was T2 sentinel node negative, ER/PR positive, HER2 negative on the right in June 2005, treated with lumpectomy and sentinel node evaluation, local radiation by Dr. Dayton Scrape and [facility]ara from Aug.2005 thru Dec.2010 and now on observation. 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 She had a second primary in left breast in 2002, 1.6 cm grade 3 invasive ductal with nodes negative, triple negative, treated with mastectomy with TRAM and adjuvant CMF for 8 cycles.  Last mammogram was at [facility] 09-24-2010 with no mammographic findings of concern, breast tissue on right "almost entirely fatty". Note she cannot tolerate lying prone for breast MRI, did have gamma imaging at [facility] July 2010.  Patient is followed primarily by Dr.Kimberly Renae Gloss, whom she sees every 6 months; she is still followed by general surgery, to be assigned to another physician there as Dr.Weatherly is retiring. She had labs done by Dr Renae Gloss in Oct and we did not repeat these now.  Jenna Jordan has been doing very well, with no complaints that seem referable to breast cancer history or that treatment. 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 She has had no recent infectious symptoms and no respiratory, GI, neurologic or other musculoskeletal problems. She has noticed no changes in right breast or left TRAM and no swelling in upper extremities. Remainder of full 10 point Review of Systems  negative.  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  Objective:  Vital signs in last 24 hours:  BP 128/81  Pulse 76  Temp(Src) 98 F (36.7 C) (Oral)  Ht 5\' 6"  (1.676 m)  Wt 223 lb (101.152 kg)  BMI 35.99 kg/m2  Alert, looks comfortable, easily mobile, very pleasant as always.  HEENT:mucous membranes moist, pharynx normal without lesions. PERRL. Neck supple LymphaticsCervical, supraclavicular, and axillary nodes normal.No inguinal adenopathy Resp: clear to auscultation bilaterally and normal percussion bilaterally Cardio: regular rate and rhythm GI: soft, non-tender; bowel sounds normal; no masses,  no organomegaly Extremities: extremities normal, atraumatic, no cyanosis or edema Neuro nonfocal Breasts: left TRAM without evidence of local recurrence, axilla benign. Right breast with well-healed lumpectomy scar, no dominant masses or other findings of concern, right axilla not remarkable   Lab Results:  Received from Dr Mathews Robinsons office from Nov 28, 2010  Vit D 33.8 Ca 9.1, creat 0.7,Na 139, K 3.7, glucose 84 WBC 6.1, Hgb 13.6, plt 295  BMET as abpve   Studies/Results:  Mammograms as above.b  Medications: I have reviewed the patient's current medications.  Assessment/Plan:  1.History of 3 primary breast carcinomas as noted above,now out 7 1/2 years from most recent diagnosis without evidence of active disease. She is no longer on treatment, will be due mammograms again at [facility] in July. We will change follow up at this office to prn, as she is followed regularly by primary MD and general surgery. She understands that I am glad to see her back at any time if she or her other physicians feel that  I can be of help. 2. History of osteoporosis 3.Arthritic symptoms stable as above.        Jenna Packer, MD   02/23/2011, 4:28 PM

## 2011-09-16 ENCOUNTER — Other Ambulatory Visit: Payer: Self-pay | Admitting: Gastroenterology

## 2011-09-17 ENCOUNTER — Other Ambulatory Visit: Payer: Self-pay | Admitting: Internal Medicine

## 2011-09-17 DIAGNOSIS — Z1231 Encounter for screening mammogram for malignant neoplasm of breast: Secondary | ICD-10-CM

## 2011-09-17 DIAGNOSIS — Z9012 Acquired absence of left breast and nipple: Secondary | ICD-10-CM

## 2011-10-15 ENCOUNTER — Ambulatory Visit: Payer: Medicare Other

## 2011-10-16 ENCOUNTER — Ambulatory Visit: Payer: Medicare Other

## 2011-10-25 ENCOUNTER — Ambulatory Visit
Admission: RE | Admit: 2011-10-25 | Discharge: 2011-10-25 | Disposition: A | Discharge status: Home / Self Care | Payer: Medicare Other | Source: Ambulatory Visit | Attending: Internal Medicine | Admitting: Internal Medicine

## 2011-10-25 DIAGNOSIS — Z1231 Encounter for screening mammogram for malignant neoplasm of breast: Secondary | ICD-10-CM

## 2011-10-25 DIAGNOSIS — Z9012 Acquired absence of left breast and nipple: Secondary | ICD-10-CM

## 2012-10-09 ENCOUNTER — Other Ambulatory Visit: Payer: Self-pay | Admitting: Family

## 2012-10-09 ENCOUNTER — Ambulatory Visit
Admission: RE | Admit: 2012-10-09 | Discharge: 2012-10-09 | Disposition: A | Discharge status: Home / Self Care | Payer: Medicare Other | Source: Ambulatory Visit | Attending: Family | Admitting: Family

## 2012-10-09 DIAGNOSIS — R52 Pain, unspecified: Secondary | ICD-10-CM

## 2012-10-29 ENCOUNTER — Other Ambulatory Visit: Payer: Self-pay

## 2012-10-29 DIAGNOSIS — Z1231 Encounter for screening mammogram for malignant neoplasm of breast: Secondary | ICD-10-CM

## 2012-11-10 ENCOUNTER — Other Ambulatory Visit: Payer: Self-pay

## 2012-11-10 ENCOUNTER — Ambulatory Visit
Admission: RE | Admit: 2012-11-10 | Discharge: 2012-11-10 | Disposition: A | Discharge status: Home / Self Care | Payer: Medicare Other | Source: Ambulatory Visit

## 2012-11-10 DIAGNOSIS — Z1231 Encounter for screening mammogram for malignant neoplasm of breast: Secondary | ICD-10-CM

## 2012-11-12 ENCOUNTER — Other Ambulatory Visit: Payer: Self-pay | Admitting: Internal Medicine

## 2012-11-12 DIAGNOSIS — R928 Other abnormal and inconclusive findings on diagnostic imaging of breast: Secondary | ICD-10-CM

## 2012-11-26 ENCOUNTER — Ambulatory Visit
Admission: RE | Admit: 2012-11-26 | Discharge: 2012-11-26 | Disposition: A | Discharge status: Home / Self Care | Payer: Medicare Other | Source: Ambulatory Visit | Attending: Internal Medicine | Admitting: Internal Medicine

## 2012-11-26 DIAGNOSIS — R928 Other abnormal and inconclusive findings on diagnostic imaging of breast: Secondary | ICD-10-CM

## 2013-01-26 ENCOUNTER — Telehealth: Payer: Self-pay | Admitting: *Deleted

## 2013-01-26 NOTE — Telephone Encounter (Signed)
Pt called and requested re:  Rx for Bra and prosthesis to be faxed to [facility].  Informed pt that MD will not be back in office until tomorrow.  Pt stated as long as it is faxed by Monday 12/8 is fine with pt.  Message left on md's desk for review. [employer] fax    [phone]. Pt's  Phone    [phone].

## 2013-01-27 ENCOUNTER — Telehealth: Payer: Self-pay

## 2013-01-27 NOTE — Telephone Encounter (Signed)
Faxed signed order for mastectomy bras and prosthesis to [facility] at [phone].  Sent a copy to HIM to be scanned into patient's EMR. Spoke with ms. Coutts and told her that the order was faxed today as requested.

## 2013-04-20 ENCOUNTER — Encounter (HOSPITAL_BASED_OUTPATIENT_CLINIC_OR_DEPARTMENT_OTHER): Payer: Self-pay | Admitting: Emergency Medicine

## 2013-04-20 ENCOUNTER — Emergency Department (HOSPITAL_BASED_OUTPATIENT_CLINIC_OR_DEPARTMENT_OTHER)
Admission: EM | Admit: 2013-04-20 | Discharge: 2013-04-20 | Disposition: A | Discharge status: Home / Self Care | Payer: Medicare Other | Attending: Emergency Medicine | Admitting: Emergency Medicine

## 2013-04-20 ENCOUNTER — Emergency Department (HOSPITAL_BASED_OUTPATIENT_CLINIC_OR_DEPARTMENT_OTHER): Payer: Medicare Other

## 2013-04-20 DIAGNOSIS — Z7982 Long term (current) use of aspirin: Secondary | ICD-10-CM | POA: Insufficient documentation

## 2013-04-20 DIAGNOSIS — I10 Essential (primary) hypertension: Secondary | ICD-10-CM | POA: Insufficient documentation

## 2013-04-20 DIAGNOSIS — Z853 Personal history of malignant neoplasm of breast: Secondary | ICD-10-CM | POA: Insufficient documentation

## 2013-04-20 DIAGNOSIS — N201 Calculus of ureter: Secondary | ICD-10-CM

## 2013-04-20 DIAGNOSIS — Z79899 Other long term (current) drug therapy: Secondary | ICD-10-CM | POA: Insufficient documentation

## 2013-04-20 LAB — URINE MICROSCOPIC-ADD ON

## 2013-04-20 LAB — CBC WITH DIFFERENTIAL/PLATELET
BASOS ABS: 0 10*3/uL (ref 0.0–0.1)
BASOS PCT: 0 % (ref 0–1)
EOS PCT: 0 % (ref 0–5)
Eosinophils Absolute: 0 10*3/uL (ref 0.0–0.7)
HEMATOCRIT: 44.2 % (ref 36.0–46.0)
Hemoglobin: 14.8 g/dL (ref 12.0–15.0)
Lymphocytes Relative: 7 % — ABNORMAL LOW (ref 12–46)
Lymphs Abs: 1.1 10*3/uL (ref 0.7–4.0)
MCH: 31.2 pg (ref 26.0–34.0)
MCHC: 33.5 g/dL (ref 30.0–36.0)
MCV: 93.2 fL (ref 78.0–100.0)
MONO ABS: 0.4 10*3/uL (ref 0.1–1.0)
Monocytes Relative: 3 % (ref 3–12)
NEUTROS ABS: 12.8 10*3/uL — AB (ref 1.7–7.7)
Neutrophils Relative %: 90 % — ABNORMAL HIGH (ref 43–77)
PLATELETS: 298 10*3/uL (ref 150–400)
RBC: 4.74 MIL/uL (ref 3.87–5.11)
RDW: 13 % (ref 11.5–15.5)
WBC: 14.2 10*3/uL — AB (ref 4.0–10.5)

## 2013-04-20 LAB — URINALYSIS, ROUTINE W REFLEX MICROSCOPIC
Bilirubin Urine: NEGATIVE
GLUCOSE, UA: NEGATIVE mg/dL
KETONES UR: NEGATIVE mg/dL
Nitrite: NEGATIVE
PROTEIN: NEGATIVE mg/dL
Specific Gravity, Urine: 1.025 (ref 1.005–1.030)
UROBILINOGEN UA: 0.2 mg/dL (ref 0.0–1.0)
pH: 5.5 (ref 5.0–8.0)

## 2013-04-20 LAB — BASIC METABOLIC PANEL
BUN: 22 mg/dL (ref 6–23)
CALCIUM: 9.6 mg/dL (ref 8.4–10.5)
CHLORIDE: 98 meq/L (ref 96–112)
CO2: 25 mEq/L (ref 19–32)
CREATININE: 1.1 mg/dL (ref 0.50–1.10)
GFR calc non Af Amer: 50 mL/min — ABNORMAL LOW (ref >90)
GFR, EST AFRICAN AMERICAN: 58 mL/min — AB (ref >90)
Glucose, Bld: 135 mg/dL — ABNORMAL HIGH (ref 70–99)
Potassium: 3.9 mEq/L (ref 3.7–5.3)
SODIUM: 140 meq/L (ref 137–147)

## 2013-04-20 MED ORDER — ONDANSETRON 4 MG PO TBDP: 4.0000 mg | ORAL_TABLET | Freq: Three times a day (TID) | ORAL | Status: DC | PRN

## 2013-04-20 MED ORDER — HYDROMORPHONE HCL 2 MG PO TABS: 2.0000 mg | ORAL_TABLET | Freq: Four times a day (QID) | ORAL | Status: DC | PRN

## 2013-04-20 NOTE — Discharge Instructions (Signed)
Kidney Stones Kidney stones (urolithiasis) are solid masses that form inside your kidneys. The intense pain is caused by the stone moving through the kidney, ureter, bladder, and urethra (urinary tract). When the stone moves, the ureter starts to spasm around the stone. The stone is usually passed in your pee (urine).  HOME CARE  Drink enough fluids to keep your pee clear or pale yellow. This helps to get the stone out.  Strain all pee through the provided strainer. Do not pee without peeing through the strainer, not even once. If you pee the stone out, catch it in the strainer. The stone may be as small as a grain of salt. Take this to your doctor. This will help your doctor figure out what you can do to try to prevent more kidney stones.  Only take medicine as told by your doctor.  Follow up with your doctor as told.  Get follow-up X-rays as told by your doctor. GET HELP IF: You have pain that gets worse even if you have been taking pain medicine. GET HELP RIGHT AWAY IF:   Your pain does not get better with medicine.  You have a fever or shaking chills.  Your pain increases and gets worse over 18 hours.  You have new belly (abdominal) pain.  You feel faint or pass out.  You are unable to pee. MAKE SURE YOU:   Understand these instructions.  Will watch your condition.  Will get help right away if you are not doing well or get worse. Document Released: 07/31/2007 Document Revised: 10/14/2012 Document Reviewed: 07/15/2012 [facility] Patient Information 2014 [place], [place].  Ureteral Colic (Kidney Stones) Ureteral colic is the result of a condition when kidney stones form inside the kidney. Once kidney stones are formed they may move into the tube that connects the kidney with the bladder (ureter). If this occurs, this condition may cause pain (colic) in the ureter.  CAUSES  Pain is caused by stone movement in the ureter and the obstruction caused by the stone. SYMPTOMS  The  pain comes and goes as the ureter contracts around the stone. The pain is usually intense, sharp, and stabbing in character. The location of the pain may move as the stone moves through the ureter. When the stone is near the kidney the pain is usually located in the back and radiates to the belly (abdomen). When the stone is ready to pass into the bladder the pain is often located in the lower abdomen on the side the stone is located. At this location, the symptoms may mimic those of a urinary tract infection with urinary frequency. Once the stone is located here it often passes into the bladder and the pain disappears completely. TREATMENT   Your caregiver will provide you with medicine for pain relief.  You may require specialized follow-up X-rays.  The absence of pain does not always mean that the stone has passed. It may have just stopped moving. If the urine remains completely obstructed, it can cause loss of kidney function or even complete destruction of the involved kidney. It is your responsibility and in your interest that X-rays and follow-ups as suggested by your caregiver are completed. Relief of pain without passage of the stone can be associated with severe damage to the kidney, including loss of kidney function on that side.  If your stone does not pass on its own, additional measures may be taken by your caregiver to ensure its removal. HOME CARE INSTRUCTIONS   Increase your  fluid intake. Water is the preferred fluid since juices containing vitamin C may acidify the urine making it less likely for certain stones (uric acid stones) to pass.  Strain all urine. A strainer will be provided. Keep all particulate matter or stones for your caregiver to inspect.  Take your pain medicine as directed.  Make a follow-up appointment with your caregiver as directed.  Remember that the goal is passage of your stone. The absence of pain does not mean the stone is gone. Follow your caregiver's  instructions.  Only take over-the-counter or prescription medicines for pain, discomfort, or fever as directed by your caregiver. SEEK MEDICAL CARE IF:   Pain cannot be controlled with the prescribed medicine.  You have a fever.  Pain continues for longer than your caregiver advises it should.  There is a change in the pain, and you develop chest discomfort or constant abdominal pain.  You feel faint or pass out. MAKE SURE YOU:   Understand these instructions.  Will watch your condition.  Will get help right away if you are not doing well or get worse. Document Released: 11/21/2004 Document Revised: 06/08/2012 Document Reviewed: 08/08/2010 [facility] Patient Information 2014 [place], [place].  Followup with urology if not improved. Return for new worse symptoms. You have a kidney stone on the left. Close to entering into your bladder. Take pain medicine as needed take antinausea medicine as needed.

## 2013-04-20 NOTE — ED Notes (Signed)
Pt c/o sudden onset left flank pain n/v x 6 hrs

## 2013-04-20 NOTE — ED Notes (Signed)
MD at bedside. 
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
## 2013-12-08 ENCOUNTER — Other Ambulatory Visit: Payer: Self-pay

## 2013-12-08 DIAGNOSIS — Z1231 Encounter for screening mammogram for malignant neoplasm of breast: Secondary | ICD-10-CM

## 2013-12-08 DIAGNOSIS — Z9012 Acquired absence of left breast and nipple: Secondary | ICD-10-CM

## 2014-01-03 ENCOUNTER — Ambulatory Visit: Payer: Medicare Other

## 2014-01-03 ENCOUNTER — Ambulatory Visit
Admission: RE | Admit: 2014-01-03 | Discharge: 2014-01-03 | Disposition: A | Discharge status: Home / Self Care | Payer: Medicare Other | Source: Ambulatory Visit

## 2014-01-03 DIAGNOSIS — Z1231 Encounter for screening mammogram for malignant neoplasm of breast: Secondary | ICD-10-CM

## 2014-01-03 DIAGNOSIS — Z9012 Acquired absence of left breast and nipple: Secondary | ICD-10-CM

## 2014-10-04 ENCOUNTER — Telehealth: Payer: Self-pay | Admitting: *Deleted

## 2014-10-04 NOTE — Telephone Encounter (Signed)
Patient called stating that she needs a prescription for a bra from [facility]. Fax # [phone]. Message sent to RN Barbaraann Share

## 2014-10-04 NOTE — Telephone Encounter (Signed)
err

## 2014-10-06 NOTE — Telephone Encounter (Signed)
Faxed signed order to [facility] supply.  Sent a copy of the signed order to HIM to be scanned into patient's EMR.

## 2014-12-28 ENCOUNTER — Other Ambulatory Visit: Payer: Self-pay

## 2014-12-28 DIAGNOSIS — Z9012 Acquired absence of left breast and nipple: Secondary | ICD-10-CM

## 2014-12-28 DIAGNOSIS — Z1231 Encounter for screening mammogram for malignant neoplasm of breast: Secondary | ICD-10-CM

## 2015-01-30 ENCOUNTER — Ambulatory Visit
Admission: RE | Admit: 2015-01-30 | Discharge: 2015-01-30 | Disposition: A | Discharge status: Home / Self Care | Payer: Medicare Other | Source: Ambulatory Visit

## 2015-01-30 DIAGNOSIS — Z1231 Encounter for screening mammogram for malignant neoplasm of breast: Secondary | ICD-10-CM

## 2015-01-30 DIAGNOSIS — Z9012 Acquired absence of left breast and nipple: Secondary | ICD-10-CM

## 2015-05-02 IMAGING — CR DG HIP (WITH OR WITHOUT PELVIS) 2-3V*L*
2 series · 2 of 2 positions shown · non-contrast
Comparison: None

CLINICAL DATA: Left hip pain

LEFT HIP - COMPLETE 2+ VIEW

[view not recorded (1 of 2)]
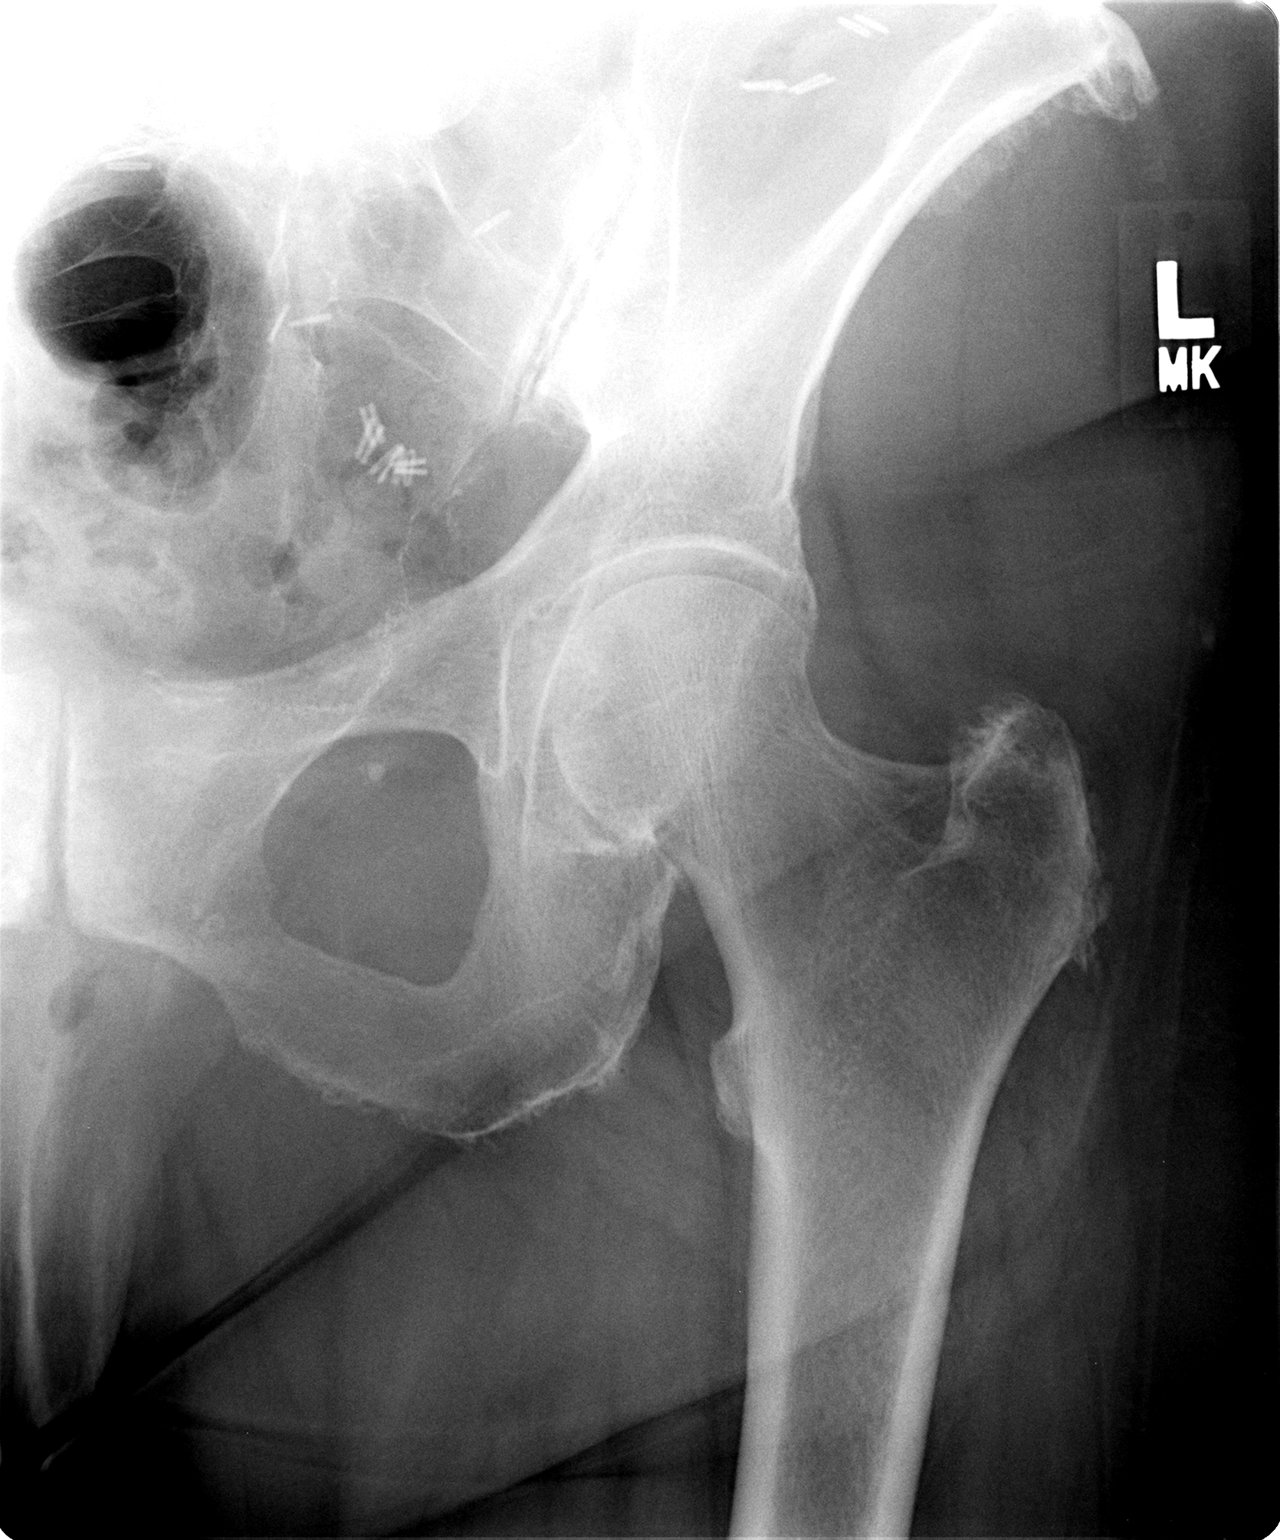

[view not recorded (2 of 2)]
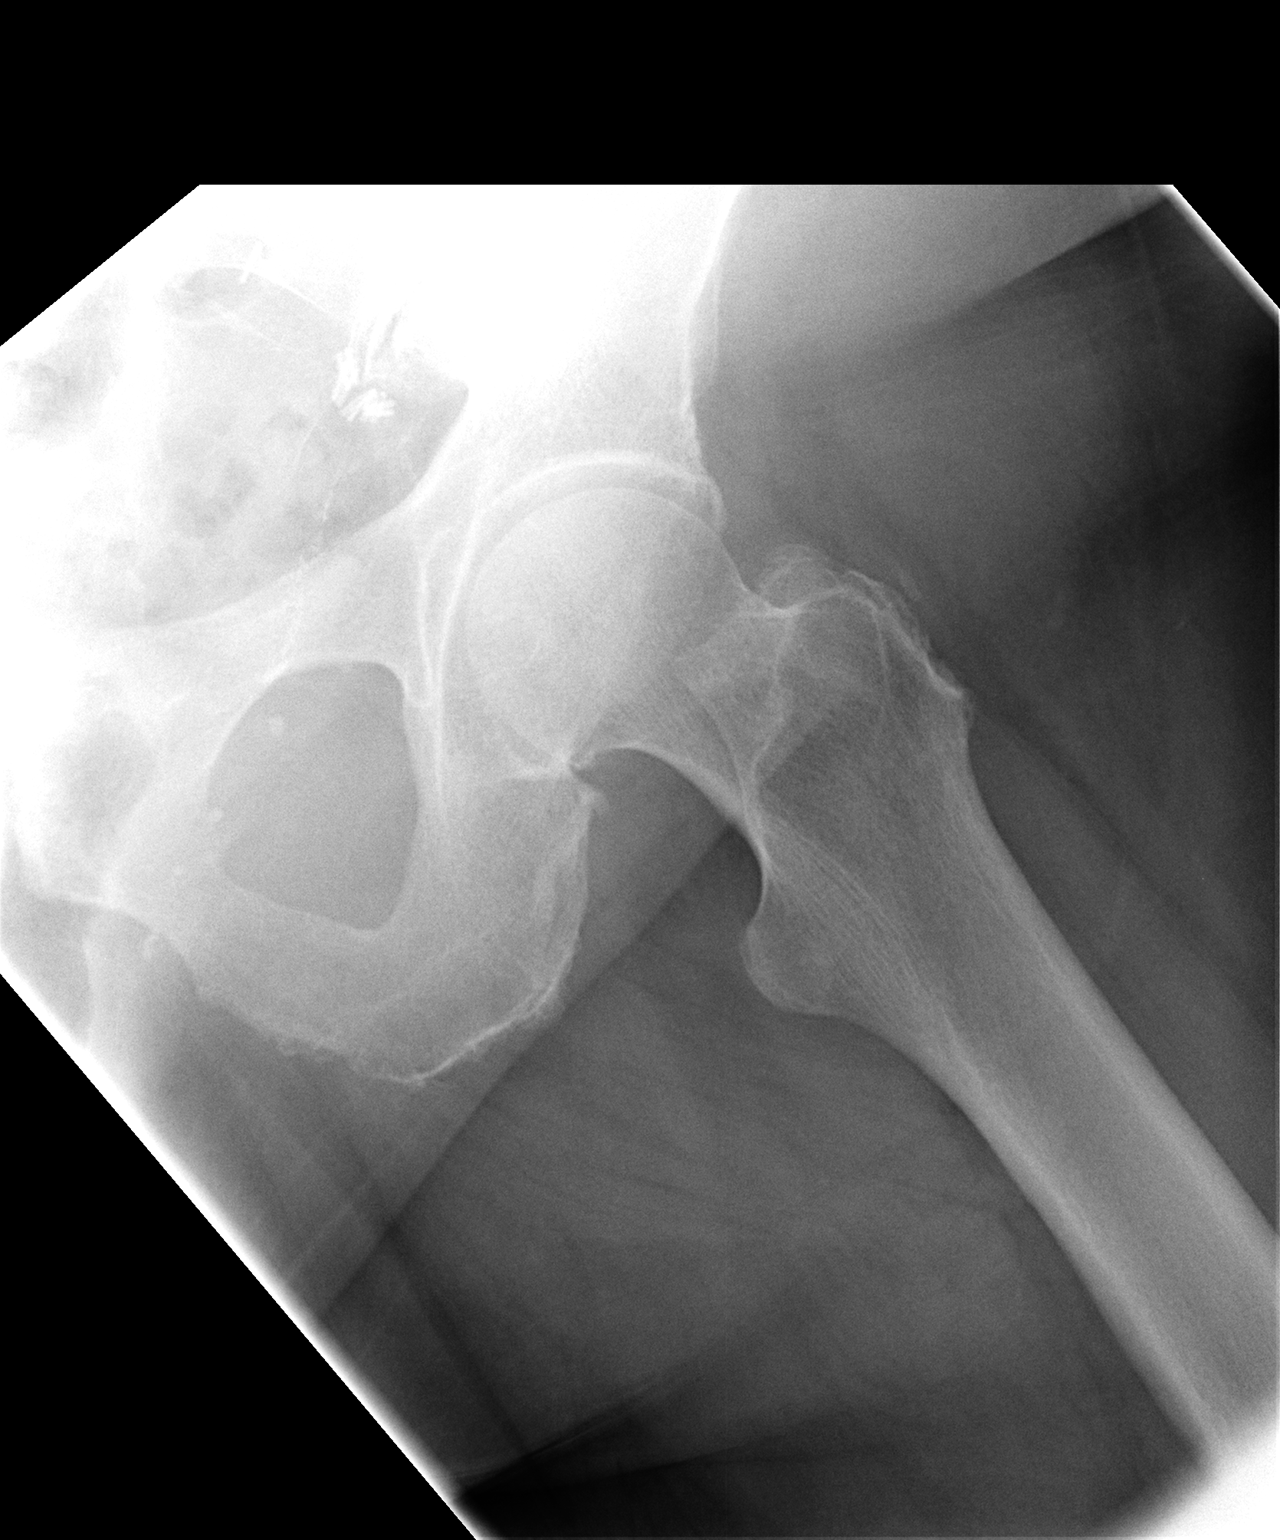

[2 of 2 positions shown; findings below may reference images not displayed]

FINDINGS: No acute fracture or dislocation is noted.  No gross soft
tissue abnormality is seen.  No significant degenerative changes
are noted.  There is mild sclerosis and osteophytic change in the
left sacroiliac joint.}
IMPRESSION: No acute abnormality in the left hip joint.

## 2015-09-04 ENCOUNTER — Other Ambulatory Visit: Payer: Self-pay | Admitting: Orthopedic Surgery

## 2015-09-04 DIAGNOSIS — M7542 Impingement syndrome of left shoulder: Secondary | ICD-10-CM

## 2015-09-10 ENCOUNTER — Ambulatory Visit
Admission: RE | Admit: 2015-09-10 | Discharge: 2015-09-10 | Disposition: A | Discharge status: Home / Self Care | Payer: Medicare Other | Source: Ambulatory Visit | Attending: Orthopedic Surgery | Admitting: Orthopedic Surgery

## 2015-09-10 DIAGNOSIS — M7542 Impingement syndrome of left shoulder: Secondary | ICD-10-CM

## 2016-01-26 ENCOUNTER — Other Ambulatory Visit: Payer: Self-pay | Admitting: Internal Medicine

## 2016-01-26 DIAGNOSIS — E2839 Other primary ovarian failure: Secondary | ICD-10-CM

## 2016-01-26 DIAGNOSIS — Z1231 Encounter for screening mammogram for malignant neoplasm of breast: Secondary | ICD-10-CM

## 2016-02-13 ENCOUNTER — Encounter (HOSPITAL_COMMUNITY): Payer: Self-pay | Admitting: Emergency Medicine

## 2016-02-13 ENCOUNTER — Emergency Department (HOSPITAL_COMMUNITY)
Admission: EM | Admit: 2016-02-13 | Discharge: 2016-02-13 | Disposition: A | Discharge status: Home / Self Care | Payer: Medicare Other | Attending: Emergency Medicine | Admitting: Emergency Medicine

## 2016-02-13 DIAGNOSIS — Z853 Personal history of malignant neoplasm of breast: Secondary | ICD-10-CM | POA: Insufficient documentation

## 2016-02-13 DIAGNOSIS — M545 Low back pain, unspecified: Secondary | ICD-10-CM

## 2016-02-13 DIAGNOSIS — Z79899 Other long term (current) drug therapy: Secondary | ICD-10-CM | POA: Insufficient documentation

## 2016-02-13 DIAGNOSIS — I10 Essential (primary) hypertension: Secondary | ICD-10-CM | POA: Insufficient documentation

## 2016-02-13 DIAGNOSIS — Z7982 Long term (current) use of aspirin: Secondary | ICD-10-CM | POA: Insufficient documentation

## 2016-02-13 LAB — URINALYSIS, ROUTINE W REFLEX MICROSCOPIC
Bilirubin Urine: NEGATIVE
GLUCOSE, UA: NEGATIVE mg/dL
KETONES UR: NEGATIVE mg/dL
Nitrite: NEGATIVE
PROTEIN: NEGATIVE mg/dL
Specific Gravity, Urine: 1.013 (ref 1.005–1.030)
pH: 6 (ref 5.0–8.0)

## 2016-02-13 MED ORDER — LORAZEPAM 2 MG/ML IJ SOLN
0.5000 mg | Freq: Once | INTRAMUSCULAR | Status: AC
  Administered 2016-02-13: 0.5 mg via INTRAVENOUS
  Filled 2016-02-13: qty 1

## 2016-02-13 MED ORDER — KETOROLAC TROMETHAMINE 15 MG/ML IJ SOLN
15.0000 mg | Freq: Once | INTRAMUSCULAR | Status: AC
  Administered 2016-02-13: 15 mg via INTRAVENOUS
  Filled 2016-02-13: qty 1

## 2016-02-13 MED ORDER — ORPHENADRINE CITRATE ER 100 MG PO TB12: 100.0000 mg | ORAL_TABLET | Freq: Two times a day (BID) | ORAL | 0 refills | Status: DC | PRN

## 2016-02-13 NOTE — ED Notes (Signed)
Bed: [id number] Expected date:  Expected time:  Means of arrival:  Comments: F-Back pain

## 2016-02-13 NOTE — ED Triage Notes (Signed)
Pt reports she was bending over putting drinks in cupboard and began having increasing back pain. Pt admits that last Wednesday she was bending over and felt something pop in her bag that was accompanied by mild pain but to nights is far worse.

## 2016-02-13 NOTE — Discharge Instructions (Signed)
Take naproxen twice a day. Take acetaminophen as needed for additional pain relief.
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
## 2016-03-07 ENCOUNTER — Ambulatory Visit
Admission: RE | Admit: 2016-03-07 | Discharge: 2016-03-07 | Disposition: A | Discharge status: Home / Self Care | Payer: Medicare Other | Source: Ambulatory Visit | Attending: Internal Medicine | Admitting: Internal Medicine

## 2016-03-07 DIAGNOSIS — E2839 Other primary ovarian failure: Secondary | ICD-10-CM

## 2016-03-07 DIAGNOSIS — Z1231 Encounter for screening mammogram for malignant neoplasm of breast: Secondary | ICD-10-CM

## 2016-07-31 ENCOUNTER — Telehealth: Payer: Self-pay

## 2016-07-31 NOTE — Telephone Encounter (Signed)
Pt called for rx for mastectomy bras. Informed her that Dr Marko Plume has retired. Suggested she contact her PCP for this prescription. Pt was amenable.

## 2017-01-29 ENCOUNTER — Other Ambulatory Visit: Payer: Self-pay | Admitting: Dermatology

## 2017-07-17 ENCOUNTER — Other Ambulatory Visit: Payer: Self-pay | Admitting: Internal Medicine

## 2017-07-17 DIAGNOSIS — Z1231 Encounter for screening mammogram for malignant neoplasm of breast: Secondary | ICD-10-CM

## 2017-08-08 ENCOUNTER — Other Ambulatory Visit: Payer: Self-pay

## 2017-08-08 ENCOUNTER — Encounter (HOSPITAL_BASED_OUTPATIENT_CLINIC_OR_DEPARTMENT_OTHER): Payer: Self-pay | Admitting: Adult Health

## 2017-08-08 ENCOUNTER — Emergency Department (HOSPITAL_BASED_OUTPATIENT_CLINIC_OR_DEPARTMENT_OTHER)
Admission: EM | Admit: 2017-08-08 | Discharge: 2017-08-08 | Disposition: A | Discharge status: Home / Self Care | Payer: Medicare Other | Attending: Emergency Medicine | Admitting: Emergency Medicine

## 2017-08-08 ENCOUNTER — Emergency Department (HOSPITAL_BASED_OUTPATIENT_CLINIC_OR_DEPARTMENT_OTHER): Payer: Medicare Other

## 2017-08-08 DIAGNOSIS — Z9012 Acquired absence of left breast and nipple: Secondary | ICD-10-CM | POA: Diagnosis not present

## 2017-08-08 DIAGNOSIS — N201 Calculus of ureter: Secondary | ICD-10-CM | POA: Insufficient documentation

## 2017-08-08 DIAGNOSIS — I10 Essential (primary) hypertension: Secondary | ICD-10-CM | POA: Diagnosis not present

## 2017-08-08 DIAGNOSIS — D72829 Elevated white blood cell count, unspecified: Secondary | ICD-10-CM | POA: Insufficient documentation

## 2017-08-08 DIAGNOSIS — Z79899 Other long term (current) drug therapy: Secondary | ICD-10-CM | POA: Diagnosis not present

## 2017-08-08 DIAGNOSIS — Z853 Personal history of malignant neoplasm of breast: Secondary | ICD-10-CM | POA: Insufficient documentation

## 2017-08-08 DIAGNOSIS — Z7982 Long term (current) use of aspirin: Secondary | ICD-10-CM | POA: Diagnosis not present

## 2017-08-08 DIAGNOSIS — R319 Hematuria, unspecified: Secondary | ICD-10-CM | POA: Diagnosis present

## 2017-08-08 LAB — BASIC METABOLIC PANEL
Anion gap: 11 (ref 5–15)
BUN: 24 mg/dL — AB (ref 6–20)
CALCIUM: 9.4 mg/dL (ref 8.9–10.3)
CO2: 22 mmol/L (ref 22–32)
CREATININE: 0.87 mg/dL (ref 0.44–1.00)
Chloride: 106 mmol/L (ref 101–111)
GFR calc Af Amer: >60 mL/min (ref >60)
GFR calc non Af Amer: >60 mL/min (ref >60)
GLUCOSE: 113 mg/dL — AB (ref 65–99)
Potassium: 4.1 mmol/L (ref 3.5–5.1)
Sodium: 139 mmol/L (ref 135–145)

## 2017-08-08 LAB — CBC WITH DIFFERENTIAL/PLATELET
Basophils Absolute: 0 10*3/uL (ref 0.0–0.1)
Basophils Relative: 0 %
Eosinophils Absolute: 0.2 10*3/uL (ref 0.0–0.7)
Eosinophils Relative: 2 %
HEMATOCRIT: 43 % (ref 36.0–46.0)
Hemoglobin: 14.3 g/dL (ref 12.0–15.0)
Lymphocytes Relative: 17 %
Lymphs Abs: 2 10*3/uL (ref 0.7–4.0)
MCH: 30.6 pg (ref 26.0–34.0)
MCHC: 33.3 g/dL (ref 30.0–36.0)
MCV: 91.9 fL (ref 78.0–100.0)
MONO ABS: 0.7 10*3/uL (ref 0.1–1.0)
MONOS PCT: 6 %
NEUTROS ABS: 9 10*3/uL — AB (ref 1.7–7.7)
Neutrophils Relative %: 75 %
Platelets: 269 10*3/uL (ref 150–400)
RBC: 4.68 MIL/uL (ref 3.87–5.11)
RDW: 13 % (ref 11.5–15.5)
WBC: 11.9 10*3/uL — ABNORMAL HIGH (ref 4.0–10.5)

## 2017-08-08 LAB — URINALYSIS, MICROSCOPIC (REFLEX): RBC / HPF: >50 RBC/hpf (ref 0–5)

## 2017-08-08 LAB — URINALYSIS, ROUTINE W REFLEX MICROSCOPIC

## 2017-08-08 MED ORDER — HYDROMORPHONE HCL 2 MG PO TABS: 1.0000 mg | ORAL_TABLET | Freq: Four times a day (QID) | ORAL | 0 refills | Status: DC | PRN

## 2017-08-08 MED ORDER — CEPHALEXIN 500 MG PO CAPS: 500.0000 mg | ORAL_CAPSULE | Freq: Two times a day (BID) | ORAL | 0 refills | Status: DC

## 2017-08-08 MED ORDER — ONDANSETRON 8 MG PO TBDP: 8.0000 mg | ORAL_TABLET | Freq: Three times a day (TID) | ORAL | 0 refills | Status: DC | PRN

## 2017-08-08 MED ORDER — ONDANSETRON HCL 4 MG/2ML IJ SOLN
4.0000 mg | Freq: Once | INTRAMUSCULAR | Status: AC
  Administered 2017-08-08: 4 mg via INTRAVENOUS
  Filled 2017-08-08: qty 2

## 2017-08-08 MED ORDER — SODIUM CHLORIDE 0.9 % IV SOLN
1.0000 g | Freq: Once | INTRAVENOUS | Status: AC
  Administered 2017-08-08: 1 g via INTRAVENOUS
  Filled 2017-08-08: qty 10

## 2017-08-08 MED ORDER — FENTANYL CITRATE (PF) 100 MCG/2ML IJ SOLN
50.0000 ug | Freq: Once | INTRAMUSCULAR | Status: AC
  Administered 2017-08-08: 50 ug via INTRAVENOUS
  Filled 2017-08-08: qty 2

## 2017-08-08 NOTE — Discharge Instructions (Signed)
Follow-up with urologist, schedule an appointment.  Return to the emergency room for fever, vomiting, worsening symptoms

## 2017-08-08 NOTE — ED Notes (Signed)
Patient transported to CT 

## 2017-08-08 NOTE — ED Notes (Signed)
ED Provider at bedside. 

## 2017-08-08 NOTE — ED Triage Notes (Signed)
C/o right lower abdominal /groin pain that began this AM associated with hematuria. Urine is tea colored. Denies fevers.
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
## 2017-08-08 NOTE — ED Notes (Signed)
Pt understood dc material. NAD noted. Scripts given at [employer]. Topaz pad malfunctioned and locked screen so no WOW could be used

## 2017-08-14 ENCOUNTER — Ambulatory Visit
Admission: RE | Admit: 2017-08-14 | Discharge: 2017-08-14 | Disposition: A | Discharge status: Home / Self Care | Payer: Medicare Other | Source: Ambulatory Visit | Attending: Internal Medicine | Admitting: Internal Medicine

## 2017-08-14 DIAGNOSIS — Z1231 Encounter for screening mammogram for malignant neoplasm of breast: Secondary | ICD-10-CM

## 2017-09-11 ENCOUNTER — Other Ambulatory Visit: Payer: Self-pay | Admitting: Urology

## 2017-09-12 ENCOUNTER — Encounter (HOSPITAL_COMMUNITY): Payer: Self-pay | Admitting: *Deleted

## 2017-09-15 ENCOUNTER — Encounter (HOSPITAL_COMMUNITY)
Admission: RE | Disposition: A | Discharge status: Home / Self Care | Payer: Self-pay | Source: Ambulatory Visit | Attending: Urology

## 2017-09-15 ENCOUNTER — Ambulatory Visit (HOSPITAL_COMMUNITY): Payer: Medicare Other

## 2017-09-15 ENCOUNTER — Ambulatory Visit (HOSPITAL_COMMUNITY)
Admission: RE | Admit: 2017-09-15 | Discharge: 2017-09-15 | Disposition: A | Discharge status: Home / Self Care | Payer: Medicare Other | Source: Ambulatory Visit | Attending: Urology | Admitting: Urology

## 2017-09-15 ENCOUNTER — Encounter (HOSPITAL_COMMUNITY): Payer: Self-pay | Admitting: General Practice

## 2017-09-15 DIAGNOSIS — Z853 Personal history of malignant neoplasm of breast: Secondary | ICD-10-CM | POA: Insufficient documentation

## 2017-09-15 DIAGNOSIS — I10 Essential (primary) hypertension: Secondary | ICD-10-CM | POA: Diagnosis not present

## 2017-09-15 DIAGNOSIS — Z7901 Long term (current) use of anticoagulants: Secondary | ICD-10-CM | POA: Diagnosis not present

## 2017-09-15 DIAGNOSIS — Z923 Personal history of irradiation: Secondary | ICD-10-CM | POA: Diagnosis not present

## 2017-09-15 DIAGNOSIS — Z882 Allergy status to sulfonamides status: Secondary | ICD-10-CM | POA: Diagnosis not present

## 2017-09-15 DIAGNOSIS — Z9221 Personal history of antineoplastic chemotherapy: Secondary | ICD-10-CM | POA: Diagnosis not present

## 2017-09-15 DIAGNOSIS — N200 Calculus of kidney: Secondary | ICD-10-CM | POA: Diagnosis present

## 2017-09-15 DIAGNOSIS — Z885 Allergy status to narcotic agent status: Secondary | ICD-10-CM | POA: Insufficient documentation

## 2017-09-15 DIAGNOSIS — Z7982 Long term (current) use of aspirin: Secondary | ICD-10-CM | POA: Diagnosis not present

## 2017-09-15 DIAGNOSIS — Z6839 Body mass index (BMI) 39.0-39.9, adult: Secondary | ICD-10-CM | POA: Insufficient documentation

## 2017-09-15 DIAGNOSIS — E669 Obesity, unspecified: Secondary | ICD-10-CM | POA: Diagnosis not present

## 2017-09-15 DIAGNOSIS — Z888 Allergy status to other drugs, medicaments and biological substances status: Secondary | ICD-10-CM | POA: Insufficient documentation

## 2017-09-15 DIAGNOSIS — Z79899 Other long term (current) drug therapy: Secondary | ICD-10-CM | POA: Diagnosis not present

## 2017-09-15 DIAGNOSIS — Z8249 Family history of ischemic heart disease and other diseases of the circulatory system: Secondary | ICD-10-CM | POA: Diagnosis not present

## 2017-09-15 HISTORY — DX: Personal history of urinary calculi: Z87.442

## 2017-09-15 HISTORY — PX: EXTRACORPOREAL SHOCK WAVE LITHOTRIPSY: SHX1557

## 2017-09-15 SURGERY — LITHOTRIPSY, ESWL
Anesthesia: LOCAL | Laterality: Right

## 2017-09-15 MED ORDER — DIAZEPAM 5 MG PO TABS
10.0000 mg | ORAL_TABLET | ORAL | Status: AC
  Administered 2017-09-15: 10 mg via ORAL
  Filled 2017-09-15: qty 2

## 2017-09-15 MED ORDER — DIPHENHYDRAMINE HCL 25 MG PO CAPS
25.0000 mg | ORAL_CAPSULE | ORAL | Status: AC
  Administered 2017-09-15: 25 mg via ORAL
  Filled 2017-09-15: qty 1

## 2017-09-15 MED ORDER — SODIUM CHLORIDE 0.9 % IV SOLN
INTRAVENOUS | Status: DC
  Administered 2017-09-15: 08:00:00 via INTRAVENOUS

## 2017-09-15 MED ORDER — CIPROFLOXACIN HCL 500 MG PO TABS
500.0000 mg | ORAL_TABLET | ORAL | Status: AC
  Administered 2017-09-15: 500 mg via ORAL
  Filled 2017-09-15: qty 1

## 2017-09-15 NOTE — Discharge Instructions (Signed)
1 - You may have urinary urgency (bladder spasms), pass small stone fragments, and bloody urine on / off for 2 weeks. This is normal.  2 - Call MD or go to ER for fever >102, severe pain / nausea / vomiting not relieved by medications, or acute change in medical status
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
## 2017-09-15 NOTE — Progress Notes (Signed)
Update:  Unable to target Rt renal stone with fluoroscopy on lithotripter. Stone is non-obstructing. Discussed with pt and we both agree to NON-treat today.   She has f/u scheduled to discuss either observation v. Ureteroscopy. NO med changes.   Clint Lipps

## 2017-12-15 ENCOUNTER — Encounter (HOSPITAL_COMMUNITY): Payer: Self-pay | Admitting: Urology

## 2017-12-16 ENCOUNTER — Telehealth: Payer: Self-pay | Admitting: *Deleted

## 2017-12-16 NOTE — Telephone Encounter (Signed)
"  Jenna Jordan [phone]).  I need new bras.  Need Dr. Marko Plume to fax an order for mastectomy supplies."  Most recent [facility] provider visit on 02-22-2011 Kassie A Hedges was 7 1/2 years out with no active disease.  Plan for [facility] F/U to be followed regularly by PCP.  No F/U or re-assignment at this time.   Advised to contact PCP with this request.  Dr. Marko Plume has retired.    "They can do that?"  Yes, PCP can re-order needed bras and mastectomy supplies.  No further questions or needs at this time.

## 2018-01-02 ENCOUNTER — Other Ambulatory Visit: Payer: Self-pay | Admitting: Urology

## 2018-01-06 ENCOUNTER — Other Ambulatory Visit: Payer: Self-pay

## 2018-01-06 ENCOUNTER — Encounter (HOSPITAL_COMMUNITY)
Admission: RE | Admit: 2018-01-06 | Discharge: 2018-01-06 | Disposition: A | Discharge status: Home / Self Care | Payer: Medicare Other | Source: Ambulatory Visit | Attending: Urology | Admitting: Urology

## 2018-01-06 ENCOUNTER — Encounter (HOSPITAL_COMMUNITY): Payer: Self-pay

## 2018-01-06 DIAGNOSIS — Z01818 Encounter for other preprocedural examination: Secondary | ICD-10-CM | POA: Diagnosis present

## 2018-01-06 DIAGNOSIS — I10 Essential (primary) hypertension: Secondary | ICD-10-CM | POA: Insufficient documentation

## 2018-01-06 HISTORY — DX: Nausea with vomiting, unspecified: R11.2

## 2018-01-06 HISTORY — DX: Adverse effect of unspecified anesthetic, initial encounter: T41.45XA

## 2018-01-06 HISTORY — DX: Other complications of anesthesia, initial encounter: T88.59XA

## 2018-01-06 HISTORY — DX: Other specified postprocedural states: Z98.890

## 2018-01-06 LAB — BASIC METABOLIC PANEL
ANION GAP: 10 (ref 5–15)
BUN: 32 mg/dL — ABNORMAL HIGH (ref 8–23)
CHLORIDE: 104 mmol/L (ref 98–111)
CO2: 26 mmol/L (ref 22–32)
Calcium: 10.1 mg/dL (ref 8.9–10.3)
Creatinine, Ser: 1.43 mg/dL — ABNORMAL HIGH (ref 0.44–1.00)
GFR calc Af Amer: 41 mL/min — ABNORMAL LOW (ref >60)
GFR calc non Af Amer: 35 mL/min — ABNORMAL LOW (ref >60)
GLUCOSE: 97 mg/dL (ref 70–99)
POTASSIUM: 3.8 mmol/L (ref 3.5–5.1)
Sodium: 140 mmol/L (ref 135–145)

## 2018-01-06 LAB — CBC
HCT: 41.7 % (ref 36.0–46.0)
HEMOGLOBIN: 13.5 g/dL (ref 12.0–15.0)
MCH: 31 pg (ref 26.0–34.0)
MCHC: 32.4 g/dL (ref 30.0–36.0)
MCV: 95.6 fL (ref 80.0–100.0)
Platelets: 335 10*3/uL (ref 150–400)
RBC: 4.36 MIL/uL (ref 3.87–5.11)
RDW: 12.6 % (ref 11.5–15.5)
WBC: 9.1 10*3/uL (ref 4.0–10.5)
nRBC: 0 % (ref 0.0–0.2)

## 2018-01-06 NOTE — Progress Notes (Signed)
01-06-18 BMP result routed to Dr. Jeffie Pollock for review.

## 2018-01-06 NOTE — Patient Instructions (Addendum)
Jenna Jordan  01/06/2018   Your procedure is scheduled on: 01-13-18     Report to [facility] Main  Entrance    Report to Admitting at 7:00 AM    Call this number if you have problems the morning of surgery [phone]     Remember: Do not eat food or drink liquids :After Midnight.    BRUSH YOUR TEETH MORNING OF SURGERY AND RINSE YOUR MOUTH OUT, NO CHEWING GUM CANDY OR MINTS.     Take these medicines the morning of surgery with A SIP OF WATER: None.                                You may not have any metal on your body including hair pins and              piercings  Do not wear jewelry, make-up, lotions, powders or perfumes, deodorant             Do not wear nail polish.  Do not shave  48 hours prior to surgery.                 Do not bring valuables to the hospital. [place].  Contacts, dentures or bridgework may not be worn into surgery.      Patients discharged the day of surgery will not be allowed to drive home.  Name and phone number of your driver: Jenna Jordan [phone]  Special Instructions:               Please read over the following fact sheets you were given: _____________________________________________________________________             [facility] - Preparing for Surgery Before surgery, you can play an important role.  Because skin is not sterile, your skin needs to be as free of germs as possible.  You can reduce the number of germs on your skin by washing with CHG (chlorahexidine gluconate) soap before surgery.  CHG is an antiseptic cleaner which kills germs and bonds with the skin to continue killing germs even after washing. Please DO NOT use if you have an allergy to CHG or antibacterial soaps.  If your skin becomes reddened/irritated stop using the CHG and inform your nurse when you arrive at Short Stay. Do not shave (including legs and underarms) for at least 48 hours  prior to the first CHG shower.  You may shave your face/neck. Please follow these instructions carefully:  1.  Shower with CHG Soap the night before surgery and the  morning of Surgery.  2.  If you choose to wash your hair, wash your hair first as usual with your  normal  shampoo.  3.  After you shampoo, rinse your hair and body thoroughly to remove the  shampoo.                           4.  Use CHG as you would any other liquid soap.  You can apply chg directly  to the skin and wash                       Gently with a  scrungie or clean washcloth.  5.  Apply the CHG Soap to your body ONLY FROM THE NECK DOWN.   Do not use on face/ open                           Wound or open sores. Avoid contact with eyes, ears mouth and genitals (private parts).                       Wash face,  Genitals (private parts) with your normal soap.             6.  Wash thoroughly, paying special attention to the area where your surgery  will be performed.  7.  Thoroughly rinse your body with warm water from the neck down.  8.  DO NOT shower/wash with your normal soap after using and rinsing off  the CHG Soap.                9.  Pat yourself dry with a clean towel.            10.  Wear clean pajamas.            11.  Place clean sheets on your bed the night of your first shower and do not  sleep with pets. Day of Surgery : Do not apply any lotions/deodorants the morning of surgery.  Please wear clean clothes to the hospital/surgery center.  FAILURE TO FOLLOW THESE INSTRUCTIONS MAY RESULT IN THE CANCELLATION OF YOUR SURGERY PATIENT SIGNATURE_________________________________  NURSE SIGNATURE__________________________________  ________________________________________________________________________

## 2018-01-07 NOTE — Progress Notes (Signed)
RN following up on chart left by Dolores Lory, Therapist, sports.   Ischemia noted on EKG by confirming MD . RN took copy of unconfirmed and confirmed EKG to be reviewed by anesthesia Dr Cherylynn Ridges. RN made [facility] aware of type of scheduled surgery and patient hx hypertension; also that there were no notes by pre-op nurse indicating that patient c/o chest pain.  Per Dr Valma Cava, as patient is assumed to be asymptomatic; EKG is not concerning. No other recommendations received.

## 2018-01-12 NOTE — H&P (Signed)
CC: I have kidney stones.  HPI: Jenna Jordan is a 77 year-old female established patient who is here for renal calculi.  The problem is on the right side.   Jenna Jordan was scheduled for ESWL of a right renal calculi on the 07/17 but due to the stone being well visualized, the procedure was cancelled. She She has passed about 3 stones and sand since then. She brought one for analysis today. She has had no further pain since 10/4 when she last passed a stone. She has stable microhematuria with 3-10 RBC's today. She has no associated signs or symptoms.      ALLERGIES: codiene Demerol Morphine Phenergan Septra    MEDICATIONS: Aspir 81  Calcium  Fenofibrate  Fish Oil  Magnesium  Meloxicam 7.5 mg tablet  Probiotic  Triamterene-Hydrochlorothiazid 37.5 mg-25 mg capsule  Vitamin B12 5,000 mcg tablet,disintegrating  Vitamin C 1,000 mg tablet  Vitamin D3 1,000 unit capsule     GU PSH: ESWL, Right - 09/15/2017      PSH Notes: L breast removed  L Rotators Cuff Repair    NON-GU PSH: [facility] BREAST CANCER STAGE III Breast mastectomy Hysterectomy    GU PMH: Renal calculus - 09/29/2017, She appears to have passed the right distal stones but she has a 32mm right renal stone that is mobile in the collecting system and needs to be treated. I discussed URS and ESWL and will get her set up for ESWL in the next couple of weeks. I reviewed the risks of ESWL including bleeding, infection, injury to the kidney or adjacent structures, failure to fragment the stone, need for ancillary procedures, thrombotic events, cardiac arrhythmias and sedation complications. , - 09/10/2017      PMH Notes: Breast cancer with last of 3 episodes in 2005. She had chemo and radtx with her surgeries.    NON-GU PMH: Arthritis Breast Cancer, History Hypercholesterolemia Hypertension    FAMILY HISTORY: 1 Daughter - No Family History 2 sons - No Family History Cancer of mouth - Father Heart Disease - Mother,  Brother Kidney Stones - Son stroke - Mother    Notes: Throat and mouth Cancer-Father   SOCIAL HISTORY: Marital Status: Widowed Preferred Language: English; Race: White Current Smoking Status: Patient has never smoked.   Tobacco Use Assessment Completed: Used Tobacco in last 30 days? Has never drank.  Does not drink caffeine. Has not had a blood transfusion.    REVIEW OF SYSTEMS:    GU Review Female:   Patient denies frequent urination, hard to postpone urination, burning /pain with urination, get up at night to urinate, leakage of urine, stream starts and stops, trouble starting your stream, have to strain to urinate, and being pregnant.  Gastrointestinal (Upper):   Patient denies nausea, vomiting, and indigestion/ heartburn.  Gastrointestinal (Lower):   Patient denies diarrhea and constipation.  Constitutional:   Patient denies weight loss, night sweats, fever, and fatigue.  Skin:   Patient denies skin rash/ lesion and itching.  Eyes:   Patient denies blurred vision and double vision.  Ears/ Nose/ Throat:   Patient denies sore throat and sinus problems.  Hematologic/Lymphatic:   Patient denies swollen glands and easy bruising.  Cardiovascular:   Patient denies leg swelling and chest pains.  Respiratory:   Patient denies cough and shortness of breath.  Endocrine:   Patient denies excessive thirst.  Musculoskeletal:   Patient denies back pain and joint pain.  Neurological:   Patient denies headaches and dizziness.  Psychologic:  Patient denies depression and anxiety.   VITAL SIGNS:      12/31/2017 08:56 AM  Weight 238 lb / 107.95 kg  Height 66 in / 167.64 cm  BP 130/76 mmHg  Heart Rate 68 /min  BMI 38.4 kg/m   MULTI-SYSTEM PHYSICAL EXAMINATION:    Constitutional: Obese. No physical deformities. Normally developed. Good grooming.   Respiratory: Normal breath sounds. No labored breathing, no use of accessory muscles.   Cardiovascular: Regular rate and rhythm. No murmur, no  gallop.      PAST DATA REVIEWED:  Source Of History:  Patient  Urine Test Review:   Urinalysis  X-Ray Review: KUB: Reviewed Films. Discussed With Patient.  Renal Ultrasound: Reviewed Films. Discussed With Patient.  C.T. Stone Protocol: Reviewed Films. Discussed With Patient.     PROCEDURES:         C.T. Urogram - [id number]  She has a 42mm RUVJ stone with obstruction and a 5-32mm stone in the distal ureter. Full report to follow.                 KUB - [id number]  A single view of the abdomen is obtained. No stones are seen. There are clips in the left pelvis. No bone, gas or soft tissue abnormalities are noted. She has some vascular calcifications in the pelvis.                 Renal Ultrasound - [id number]  Right Kidney: Length: 12.6 cm Depth: 6.4 cm Cortical Width: 1.0 cm Width:6.4 cm  Left Kidney: Length: 11.1 cm Depth: 5.6 cm Cortical Width: 1.3 cm Width: 5.7 cm  Left Kidney/Ureter:  Multiple echogenic foci with the largest measuring 0.4 cm ( calcifications vs vessels). ---- Multiple cystic areas with no internal blood flow noted. The largest measures 1.1 cm x 0.4 cm x 0.8 cm in the lower pole.   Right Kidney/Ureter:  Hydronephrosis noted. ---- Multiple echogenic foci with the largest measuring 0.3 cm ( calcifications vs vessels).   Bladder:  PVR = not visualized.      This exam is limited due to bowel gas and body habitus.          Urinalysis w/Scope Dipstick Dipstick Cont'd Micro  Color: Yellow Bilirubin: Neg mg/dL WBC/hpf: 0 - 5/hpf  Appearance: Clear Ketones: Neg mg/dL RBC/hpf: 3 - 10/hpf  Specific Gravity: 1.025 Blood: 1+ ery/uL Bacteria: Rare (0-9/hpf)  pH: 5.5 Protein: Trace mg/dL Cystals: NS (Not Seen)  Glucose: Neg mg/dL Urobilinogen: 0.2 mg/dL Casts: NS (Not Seen)    Nitrites: Neg Trichomonas: Not Present    Leukocyte Esterase: Neg leu/uL Mucous: Not Present      Epithelial Cells: 0 - 5/hpf      Yeast: NS (Not Seen)      Sperm: Not Present    ASSESSMENT:       ICD-10 Details  1 GU:   Hydronephrosis - N13.0 She has an obtructing right UVJ stone with a larger distal stone. I discussed options and will get her set up for URS in the near future. I have reviewed the risks of ureteroscopy including bleeding, infection, ureteral injury, need for a stent or secondary procedures, thrombotic events and anesthetic complications.   2   Ureteral calculus - N20.1    PLAN:           Orders Labs Stone Analysis  X-Rays: C.T. Stone Protocol Without Contrast  X-Ray Notes: History:  Hematuria: Yes/No  Patient to see MD after exam: Yes/No  Previous exam:  CT / IVP/ US/ KUB/ None  When:  Where:  Diabetic: Yes/ No  BUN/ Creatinine:  Date of last BUN Creatinine:  Weight in pounds:  Allergy- IV Contrast: Yes/ No  Conflicting diabetic meds: Yes/ No  Diabetic Meds:  Prior Authorization #: [id number]           Schedule Return Visit/Planned Activity: ASAP - Schedule Surgery          Document Letter(s):  Created for Patient: Clinical Summary

## 2018-01-13 ENCOUNTER — Ambulatory Visit (HOSPITAL_COMMUNITY): Payer: Medicare Other

## 2018-01-13 ENCOUNTER — Ambulatory Visit (HOSPITAL_COMMUNITY): Payer: Medicare Other | Admitting: Anesthesiology

## 2018-01-13 ENCOUNTER — Encounter (HOSPITAL_COMMUNITY)
Admission: RE | Disposition: A | Discharge status: Home / Self Care | Payer: Self-pay | Source: Ambulatory Visit | Attending: Urology

## 2018-01-13 ENCOUNTER — Encounter (HOSPITAL_COMMUNITY): Payer: Self-pay | Admitting: *Deleted

## 2018-01-13 ENCOUNTER — Ambulatory Visit (HOSPITAL_COMMUNITY)
Admission: RE | Admit: 2018-01-13 | Discharge: 2018-01-13 | Disposition: A | Discharge status: Home / Self Care | Payer: Medicare Other | Source: Ambulatory Visit | Attending: Urology | Admitting: Urology

## 2018-01-13 DIAGNOSIS — Z841 Family history of disorders of kidney and ureter: Secondary | ICD-10-CM | POA: Insufficient documentation

## 2018-01-13 DIAGNOSIS — N200 Calculus of kidney: Secondary | ICD-10-CM | POA: Diagnosis present

## 2018-01-13 DIAGNOSIS — Z853 Personal history of malignant neoplasm of breast: Secondary | ICD-10-CM | POA: Insufficient documentation

## 2018-01-13 DIAGNOSIS — Z79899 Other long term (current) drug therapy: Secondary | ICD-10-CM | POA: Diagnosis not present

## 2018-01-13 DIAGNOSIS — Z7982 Long term (current) use of aspirin: Secondary | ICD-10-CM | POA: Diagnosis not present

## 2018-01-13 DIAGNOSIS — Z95810 Presence of automatic (implantable) cardiac defibrillator: Secondary | ICD-10-CM | POA: Diagnosis not present

## 2018-01-13 DIAGNOSIS — Z791 Long term (current) use of non-steroidal anti-inflammatories (NSAID): Secondary | ICD-10-CM | POA: Diagnosis not present

## 2018-01-13 DIAGNOSIS — Z9010 Acquired absence of unspecified breast and nipple: Secondary | ICD-10-CM | POA: Insufficient documentation

## 2018-01-13 DIAGNOSIS — I10 Essential (primary) hypertension: Secondary | ICD-10-CM | POA: Insufficient documentation

## 2018-01-13 DIAGNOSIS — N132 Hydronephrosis with renal and ureteral calculous obstruction: Secondary | ICD-10-CM | POA: Diagnosis not present

## 2018-01-13 DIAGNOSIS — E7800 Pure hypercholesterolemia, unspecified: Secondary | ICD-10-CM | POA: Diagnosis not present

## 2018-01-13 HISTORY — PX: CYSTOSCOPY/URETEROSCOPY/HOLMIUM LASER/STENT PLACEMENT: SHX6546

## 2018-01-13 SURGERY — CYSTOSCOPY/URETEROSCOPY/HOLMIUM LASER/STENT PLACEMENT
Anesthesia: General | Laterality: Right

## 2018-01-13 MED ORDER — PHENYLEPHRINE 40 MCG/ML (10ML) SYRINGE FOR IV PUSH (FOR BLOOD PRESSURE SUPPORT)
PREFILLED_SYRINGE | INTRAVENOUS | Status: AC
  Filled 2018-01-13: qty 10

## 2018-01-13 MED ORDER — FENTANYL CITRATE (PF) 100 MCG/2ML IJ SOLN
INTRAMUSCULAR | Status: DC | PRN
  Administered 2018-01-13 (×2): 25 ug via INTRAVENOUS

## 2018-01-13 MED ORDER — FENTANYL CITRATE (PF) 100 MCG/2ML IJ SOLN: 25.0000 ug | INTRAMUSCULAR | Status: DC | PRN

## 2018-01-13 MED ORDER — IOHEXOL 300 MG/ML  SOLN
INTRAMUSCULAR | Status: DC | PRN
  Administered 2018-01-13: 10 mL

## 2018-01-13 MED ORDER — LACTATED RINGERS IV SOLN: INTRAVENOUS | Status: DC

## 2018-01-13 MED ORDER — HYDROMORPHONE HCL 1 MG/ML IJ SOLN: 0.5000 mg | INTRAMUSCULAR | Status: DC | PRN

## 2018-01-13 MED ORDER — ONDANSETRON HCL 4 MG/2ML IJ SOLN
INTRAMUSCULAR | Status: DC | PRN
  Administered 2018-01-13: 4 mg via INTRAVENOUS

## 2018-01-13 MED ORDER — PROPOFOL 10 MG/ML IV BOLUS
INTRAVENOUS | Status: AC
  Filled 2018-01-13: qty 20

## 2018-01-13 MED ORDER — LIDOCAINE HCL (CARDIAC) PF 100 MG/5ML IV SOSY
PREFILLED_SYRINGE | INTRAVENOUS | Status: DC | PRN
  Administered 2018-01-13: 100 mg via INTRAVENOUS

## 2018-01-13 MED ORDER — ONDANSETRON HCL 4 MG/2ML IJ SOLN
INTRAMUSCULAR | Status: AC
  Filled 2018-01-13: qty 2

## 2018-01-13 MED ORDER — HYDROMORPHONE HCL 2 MG PO TABS: 2.0000 mg | ORAL_TABLET | ORAL | Status: DC | PRN

## 2018-01-13 MED ORDER — CEFAZOLIN SODIUM-DEXTROSE 2-4 GM/100ML-% IV SOLN
2.0000 g | INTRAVENOUS | Status: AC
  Administered 2018-01-13: 2 g via INTRAVENOUS
  Filled 2018-01-13: qty 100

## 2018-01-13 MED ORDER — LIDOCAINE 2% (20 MG/ML) 5 ML SYRINGE
INTRAMUSCULAR | Status: AC
  Filled 2018-01-13: qty 5

## 2018-01-13 MED ORDER — PHENYLEPHRINE HCL 10 MG/ML IJ SOLN
INTRAMUSCULAR | Status: DC | PRN
  Administered 2018-01-13 (×4): 80 ug via INTRAVENOUS

## 2018-01-13 MED ORDER — CEPHALEXIN 500 MG PO CAPS: 500.0000 mg | ORAL_CAPSULE | Freq: Three times a day (TID) | ORAL | 0 refills | Status: DC

## 2018-01-13 MED ORDER — PROPOFOL 10 MG/ML IV BOLUS
INTRAVENOUS | Status: DC | PRN
  Administered 2018-01-13: 200 mg via INTRAVENOUS

## 2018-01-13 MED ORDER — FENTANYL CITRATE (PF) 100 MCG/2ML IJ SOLN
INTRAMUSCULAR | Status: AC
  Filled 2018-01-13: qty 2

## 2018-01-13 MED ORDER — SODIUM CHLORIDE 0.9 % IR SOLN
Status: DC | PRN
  Administered 2018-01-13: 3000 mL

## 2018-01-13 MED ORDER — ONDANSETRON HCL 4 MG/2ML IJ SOLN
2.0000 mg | Freq: Once | INTRAMUSCULAR | Status: AC
  Administered 2018-01-13: 2 mg via INTRAVENOUS

## 2018-01-13 MED ORDER — LACTATED RINGERS IV SOLN
INTRAVENOUS | Status: DC
  Administered 2018-01-13: 08:00:00 via INTRAVENOUS

## 2018-01-13 SURGICAL SUPPLY — 26 items
BAG URO CATCHER STRL LF (MISCELLANEOUS) ×2 IMPLANT
BASKET STONE NCOMPASS (UROLOGICAL SUPPLIES) IMPLANT
CATH URET 5FR 28IN OPEN ENDED (CATHETERS) ×1 IMPLANT
CATH URET DUAL LUMEN 6-10FR 50 (CATHETERS) ×1 IMPLANT
CLOTH BEACON ORANGE TIMEOUT ST (SAFETY) ×2 IMPLANT
COVER WAND RF STERILE (DRAPES) IMPLANT
EXTRACTOR STONE NITINOL NGAGE (UROLOGICAL SUPPLIES) ×2 IMPLANT
FIBER LASER FLEXIVA 1000 (UROLOGICAL SUPPLIES) IMPLANT
FIBER LASER FLEXIVA 365 (UROLOGICAL SUPPLIES) ×1 IMPLANT
FIBER LASER FLEXIVA 550 (UROLOGICAL SUPPLIES) IMPLANT
FIBER LASER TRAC TIP (UROLOGICAL SUPPLIES) IMPLANT
GLOVE SURG SS PI 8.0 STRL IVOR (GLOVE) ×1 IMPLANT
GOWN STRL REUS W/TWL XL LVL3 (GOWN DISPOSABLE) ×2 IMPLANT
GUIDEWIRE ANG ZIPWIRE 038X150 (WIRE) ×1 IMPLANT
GUIDEWIRE STR DUAL SENSOR (WIRE) ×2 IMPLANT
IV NS 1000ML (IV SOLUTION) ×2
IV NS 1000ML BAXH (IV SOLUTION) ×1 IMPLANT
IV NS IRRIG 3000ML ARTHROMATIC (IV SOLUTION) ×2 IMPLANT
KIT BALLN UROMAX 15FX4 (MISCELLANEOUS) IMPLANT
KIT BALLN UROMAX 26 75X4 (MISCELLANEOUS) ×1
MANIFOLD NEPTUNE II (INSTRUMENTS) ×2 IMPLANT
PACK CYSTO (CUSTOM PROCEDURE TRAY) ×2 IMPLANT
SHEATH URETERAL 12FRX35CM (MISCELLANEOUS) ×1 IMPLANT
STENT URET 6FRX24 CONTOUR (STENTS) ×1 IMPLANT
TUBING CONNECTING 10 (TUBING) ×2 IMPLANT
TUBING UROLOGY SET (TUBING) ×2 IMPLANT

## 2018-01-13 NOTE — Interval H&P Note (Signed)
History and Physical Interval Note:  01/13/2018 7:04 AM  Jenna Jordan  has presented today for surgery, with the diagnosis of RIGHT DISTAL STONE  The various methods of treatment have been discussed with the patient and family. After consideration of risks, benefits and other options for treatment, the patient has consented to  Procedure(s): CYSTOSCOPY RIGHT URETEROSCOPY POSSIBLE [place] (Right) as a surgical intervention .  The patient's history has been reviewed, patient examined, no change in status, stable for surgery.  I have reviewed the patient's chart and labs.  Questions were answered to the patient's satisfaction.     Irine Seal

## 2018-01-13 NOTE — Transfer of Care (Signed)
Immediate Anesthesia Transfer of Care Note  Patient: Jenna Jordan  Procedure(s) Performed: Procedure(s): CYSTOSCOPY,  RIGHT URETEROSCOPY,  HOLMIUM LASER,  STENT PLACEMENT (Right)  Patient Location: PACU  Anesthesia Type:General  Level of Consciousness: Patient easily awoken, sedated, comfortable, cooperative, following commands, responds to stimulation.   Airway & Oxygen Therapy: Patient spontaneously breathing, ventilating well, oxygen via simple oxygen mask.  Post-op Assessment: Report given to PACU RN, vital signs reviewed and stable, moving all extremities.   Post vital signs: Reviewed and stable.  Complications: No apparent anesthesia complications  Last Vitals:  Vitals Value Taken Time  BP 138/73 01/13/2018  9:32 AM  Temp    Pulse 88 01/13/2018  9:33 AM  Resp    SpO2 100 % 01/13/2018  9:33 AM  Vitals shown include unvalidated device data.  Last Pain:  Vitals:   01/13/18 0729  TempSrc: Oral  PainSc: 0-No pain         Complications: No apparent anesthesia complications

## 2018-01-13 NOTE — Anesthesia Postprocedure Evaluation (Signed)
Anesthesia Post Note  Patient: Jenna Jordan  Procedure(s) Performed: CYSTOSCOPY,  RIGHT URETEROSCOPY,  HOLMIUM LASER,  STENT PLACEMENT (Right )     Patient location during evaluation: PACU Anesthesia Type: General Level of consciousness: awake and alert Pain management: pain level controlled Vital Signs Assessment: post-procedure vital signs reviewed and stable Respiratory status: spontaneous breathing, nonlabored ventilation, respiratory function stable and patient connected to nasal cannula oxygen Cardiovascular status: blood pressure returned to baseline and stable Postop Assessment: no apparent nausea or vomiting Anesthetic complications: no    Last Vitals:  Vitals:   01/13/18 1000 01/13/18 1030  BP:  130/80  Pulse:  78  Resp:  15  Temp: 36.8 C 36.8 C  SpO2:  97%    Last Pain:  Vitals:   01/13/18 1000  TempSrc:   PainSc: 0-No pain                 Effie Berkshire

## 2018-01-13 NOTE — Anesthesia Procedure Notes (Signed)
Procedure Name: LMA Insertion Date/Time: 01/13/2018 8:42 AM Performed by: Deliah Boston, CRNA Pre-anesthesia Checklist: Patient identified, Emergency Drugs available, Suction available and Patient being monitored Patient Re-evaluated:Patient Re-evaluated prior to induction Oxygen Delivery Method: Circle system utilized Preoxygenation: Pre-oxygenation with 100% oxygen Induction Type: IV induction Ventilation: Mask ventilation without difficulty LMA: LMA inserted LMA Size: 3.0 Number of attempts: 1 Placement Confirmation: positive ETCO2 and breath sounds checked- equal and bilateral Tube secured with: Tape Dental Injury: Teeth and Oropharynx as per pre-operative assessment

## 2018-01-13 NOTE — Discharge Instructions (Signed)
Ureteral Stent Implantation, Care After Refer to this sheet in the next few weeks. These instructions provide you with information about caring for yourself after your procedure. Your health care provider may also give you more specific instructions. Your treatment has been planned according to current medical practices, but problems sometimes occur. Call your health care provider if you have any problems or questions after your procedure. What can I expect after the procedure? After the procedure, it is common to have:  Nausea.  Mild pain when you urinate. You may feel this pain in your lower back or lower abdomen. Pain should stop within a few minutes after you urinate. This may last for up to 1 week.  A small amount of blood in your urine for several days.  Follow these instructions at home:  Medicines  Take over-the-counter and prescription medicines only as told by your health care provider.  If you were prescribed an antibiotic medicine, take it as told by your health care provider. Do not stop taking the antibiotic even if you start to feel better.  Do not drive for 24 hours if you received a sedative.  Do not drive or operate heavy machinery while taking prescription pain medicines. Activity  Return to your normal activities as told by your health care provider. Ask your health care provider what activities are safe for you.  Do not lift anything that is heavier than 10 lb (4.5 kg). Follow this limit for 1 week after your procedure, or for as long as told by your health care provider. General instructions  Watch for any blood in your urine. Call your health care provider if the amount of blood in your urine increases.  If you have a catheter: ? Follow instructions from your health care provider about taking care of your catheter and collection bag. ? Do not take baths, swim, or use a hot tub until your health care provider approves.  Drink enough fluid to keep your urine  clear or pale yellow.  Keep all follow-up visits as told by your health care provider. This is important. Contact a health care provider if:  You have pain that gets worse or does not get better with medicine, especially pain when you urinate.  You have difficulty urinating.  You feel nauseous or you vomit repeatedly during a period of more than 2 days after the procedure. Get help right away if:  Your urine is dark red or has blood clots in it.  You are leaking urine (have incontinence).  The end of the stent comes out of your urethra.  You cannot urinate.  You have sudden, sharp, or severe pain in your abdomen or lower back.  You have a fever.  You may pull the stent by  Using the attached string that is tucked vaginally on Monday morning.   If you don't feel comfortable with that you can come to the office to have that done.   This information is not intended to replace advice given to you by your health care provider. Make sure you discuss any questions you have with your health care provider. Document Released: 10/14/2012 Document Revised: 07/20/2015 Document Reviewed: 08/26/2014 Elsevier Interactive Patient Education  [employer].

## 2018-01-13 NOTE — Anesthesia Preprocedure Evaluation (Addendum)
Anesthesia Evaluation  Patient identified by MRN, date of birth, ID band Patient awake    Reviewed: Allergy & Precautions, NPO status , Patient's Chart, lab work & pertinent test results  History of Anesthesia Complications (+) PONV  Airway Mallampati: II  TM Distance: >3 FB Neck ROM: Full    Dental  (+) Teeth Intact, Dental Advisory Given   Pulmonary neg pulmonary ROS,    Pulmonary exam normal        Cardiovascular hypertension, Pt. on medications  Rhythm:Regular Rate:Normal - Systolic murmurs    Neuro/Psych negative neurological ROS     GI/Hepatic negative GI ROS, Neg liver ROS,   Endo/Other  negative endocrine ROS  Renal/GU negative Renal ROS     Musculoskeletal negative musculoskeletal ROS (+)   Abdominal (+) + obese,   Peds  Hematology negative hematology ROS (+)   Anesthesia Other Findings   Reproductive/Obstetrics                            Lab Results  Component Value Date   WBC 9.1 01/06/2018   HGB 13.5 01/06/2018   HCT 41.7 01/06/2018   MCV 95.6 01/06/2018   PLT 335 01/06/2018   Lab Results  Component Value Date   CREATININE 1.43 (H) 01/06/2018   BUN 32 (H) 01/06/2018   NA 140 01/06/2018   K 3.8 01/06/2018   CL 104 01/06/2018   CO2 26 01/06/2018   No results found for: INR, PROTIME  EKG: normal sinus rhythm.  Anesthesia Physical Anesthesia Plan  ASA: II  Anesthesia Plan: General   Post-op Pain Management:    Induction: Intravenous  PONV Risk Score and Plan: 4 or greater and Ondansetron, Dexamethasone, Midazolam and Scopolamine patch - Pre-op  Airway Management Planned: LMA  Additional Equipment: None  Intra-op Plan:   Post-operative Plan: Extubation in OR  Informed Consent: I have reviewed the patients History and Physical, chart, labs and discussed the procedure including the risks, benefits and alternatives for the proposed anesthesia with the  patient or authorized representative who has indicated his/her understanding and acceptance.   Dental advisory given  Plan Discussed with: CRNA  Anesthesia Plan Comments:        Anesthesia Quick Evaluation

## 2018-01-13 NOTE — Op Note (Signed)
Procedure: 1.  Cystoscopy with right retrograde pyelogram and interpretation. 2.  Right ureteroscopy with holmium laser application and stone extraction with insertion of right double-J stent.  Preop diagnosis: Right distal ureteral stone.  Postop diagnosis: Same.  Surgeon: Dr. Irine Seal.  Anesthesia: General.  Specimen: Stone fragments.  Drain: 6 [place] by 24 cm right contour double-J stent with tether.  EBL: Minimal.  Complications: None.  Indications: [name] is a 77 year old female with a history of a right ureteral stone that was initially treated with lithotripsy on 09/14/2017.  She has a large residual obstructing fragment in the right distal ureter with no pain.  It was felt that ureteroscopy was indicated.  Procedure: She was given 2 g of Ancef.  She was taken operating room where general anesthetic was induced.  She was placed in lithotomy position and fitted with PAS hose.  Her perineum and genitalia were prepped with Betadine solution and she was draped in usual sterile fashion.  Cystoscopy was then performed using a 23 [place] scope and 30 degree lens.  Examination revealed a normal urethra.  She had a cystocele with significant angulation required to visualize ureteral orifice ease which were otherwise unremarkable.  The bladder wall had mild trabeculation with no tumor stones or inflammation.  The right ureteral orifice was cannulated with a 5 French opening catheter and contrast was instilled.  Right retrograde pyelogram confirmed a filling defect in the distal ureter consistent with the stone with significant proximal dilation.  A guidewire was then passed through the open end catheter into the ureter.  The opened end catheter was then removed and a 4 cm x 15 French high-pressure balloon was passed over the wire across the ureteral meatus.  The balloon was dilated to 20 atm under fluoroscopic observation.  The balloon was then removed along with the cystoscope.  The  6.5 French semirigid ureteroscope was then inserted alongside the wire.  The stone was identified and it pushed into the mucosa on the lateral aspect of the ureter, but it was teased free into the lumen.  An initial attempt to remove the stone intact with an engage basket was unsuccessful.  The stone was then fragmented using a 365 m holmium laser fiber with the laser set on 0.5 J and 10 Hz.  The power was increased to 0.8 J to facilitate fragmentation.  Once the stone was adequately fragmented, the stones were removed using the engage basket and placed in the bladder.  The ureteroscope was removed and the cystoscope was reinserted over the wire.  A 6 French by 24 cm contour double-J stent was then advanced over the wire, during this portion of the procedure was noted that the previously placed wire was coiled in the proximal ureter which was quite dilated and tortuous.  The stent was backed out and the 5 [place] opening catheter was then replaced over the wire and a Glidewire was used to negotiate the catheter into the intrarenal collecting system.  Contrast was used to confirm placement.  The Glidewire was then replaced with the sensor wire and the opening catheter was removed.  The ureteral stent was then reinserted over the wire and once in good position removal of the wire resulted in a good coil in the kidney and in the bladder.  The stone fragments had been removed from the bladder prior to placing the stent.  The bladder was then drained and the cystoscope was removed leaving the string exiting the urethra.  The string was  tied near the meatus trimmed and tucked vaginally.  She was taken down from lithotomy position, her anesthetic was reversed and she was moved to recovery room in stable condition.  There were no complications.  She was given her stone fragments to bring to the office for analysis.

## 2018-01-14 ENCOUNTER — Encounter (HOSPITAL_COMMUNITY): Payer: Self-pay | Admitting: Urology

## 2019-01-13 ENCOUNTER — Other Ambulatory Visit: Payer: Self-pay | Admitting: Internal Medicine

## 2019-01-13 DIAGNOSIS — Z1231 Encounter for screening mammogram for malignant neoplasm of breast: Secondary | ICD-10-CM

## 2019-02-01 ENCOUNTER — Other Ambulatory Visit: Payer: Self-pay | Admitting: Internal Medicine

## 2019-02-01 DIAGNOSIS — R609 Edema, unspecified: Secondary | ICD-10-CM

## 2019-02-01 DIAGNOSIS — R220 Localized swelling, mass and lump, head: Secondary | ICD-10-CM

## 2019-02-05 ENCOUNTER — Ambulatory Visit
Admission: RE | Admit: 2019-02-05 | Discharge: 2019-02-05 | Disposition: A | Discharge status: Home / Self Care | Payer: Medicare Other | Source: Ambulatory Visit | Attending: Internal Medicine | Admitting: Internal Medicine

## 2019-02-05 DIAGNOSIS — R609 Edema, unspecified: Secondary | ICD-10-CM

## 2019-02-05 DIAGNOSIS — R220 Localized swelling, mass and lump, head: Secondary | ICD-10-CM

## 2019-03-10 ENCOUNTER — Other Ambulatory Visit: Payer: Self-pay | Admitting: Otolaryngology

## 2019-03-11 ENCOUNTER — Ambulatory Visit
Admission: RE | Admit: 2019-03-11 | Discharge: 2019-03-11 | Disposition: A | Discharge status: Home / Self Care | Payer: Medicare Other | Source: Ambulatory Visit | Attending: Internal Medicine | Admitting: Internal Medicine

## 2019-03-11 ENCOUNTER — Other Ambulatory Visit: Payer: Self-pay

## 2019-03-11 DIAGNOSIS — Z1231 Encounter for screening mammogram for malignant neoplasm of breast: Secondary | ICD-10-CM

## 2019-03-16 ENCOUNTER — Other Ambulatory Visit: Payer: Self-pay

## 2019-03-16 ENCOUNTER — Encounter (HOSPITAL_BASED_OUTPATIENT_CLINIC_OR_DEPARTMENT_OTHER): Payer: Self-pay | Admitting: Otolaryngology

## 2019-03-19 ENCOUNTER — Encounter (HOSPITAL_BASED_OUTPATIENT_CLINIC_OR_DEPARTMENT_OTHER)
Admission: RE | Admit: 2019-03-19 | Discharge: 2019-03-19 | Disposition: A | Discharge status: Home / Self Care | Payer: Medicare Other | Source: Ambulatory Visit | Attending: Otolaryngology | Admitting: Otolaryngology

## 2019-03-19 ENCOUNTER — Other Ambulatory Visit: Payer: Self-pay

## 2019-03-19 ENCOUNTER — Other Ambulatory Visit (HOSPITAL_COMMUNITY)
Admission: RE | Admit: 2019-03-19 | Discharge: 2019-03-19 | Disposition: A | Discharge status: Home / Self Care | Payer: Medicare Other | Source: Ambulatory Visit | Attending: Otolaryngology | Admitting: Otolaryngology

## 2019-03-19 DIAGNOSIS — C07 Malignant neoplasm of parotid gland: Secondary | ICD-10-CM | POA: Diagnosis not present

## 2019-03-19 DIAGNOSIS — Z01812 Encounter for preprocedural laboratory examination: Secondary | ICD-10-CM | POA: Insufficient documentation

## 2019-03-19 DIAGNOSIS — Z9011 Acquired absence of right breast and nipple: Secondary | ICD-10-CM | POA: Diagnosis not present

## 2019-03-19 DIAGNOSIS — K115 Sialolithiasis: Secondary | ICD-10-CM | POA: Diagnosis not present

## 2019-03-19 DIAGNOSIS — I10 Essential (primary) hypertension: Secondary | ICD-10-CM | POA: Diagnosis not present

## 2019-03-19 DIAGNOSIS — Z9049 Acquired absence of other specified parts of digestive tract: Secondary | ICD-10-CM | POA: Diagnosis present

## 2019-03-19 DIAGNOSIS — K1120 Sialoadenitis, unspecified: Secondary | ICD-10-CM | POA: Diagnosis not present

## 2019-03-19 DIAGNOSIS — Z6839 Body mass index (BMI) 39.0-39.9, adult: Secondary | ICD-10-CM | POA: Diagnosis not present

## 2019-03-19 DIAGNOSIS — Z853 Personal history of malignant neoplasm of breast: Secondary | ICD-10-CM | POA: Diagnosis not present

## 2019-03-19 DIAGNOSIS — Z20822 Contact with and (suspected) exposure to covid-19: Secondary | ICD-10-CM | POA: Insufficient documentation

## 2019-03-19 LAB — BASIC METABOLIC PANEL
Anion gap: 12 (ref 5–15)
BUN: 17 mg/dL (ref 8–23)
CO2: 24 mmol/L (ref 22–32)
Calcium: 10 mg/dL (ref 8.9–10.3)
Chloride: 106 mmol/L (ref 98–111)
Creatinine, Ser: 0.86 mg/dL (ref 0.44–1.00)
GFR calc Af Amer: >60 mL/min (ref >60)
GFR calc non Af Amer: >60 mL/min (ref >60)
Glucose, Bld: 102 mg/dL — ABNORMAL HIGH (ref 70–99)
Potassium: 5 mmol/L (ref 3.5–5.1)
Sodium: 142 mmol/L (ref 135–145)

## 2019-03-19 LAB — SARS CORONAVIRUS 2 (TAT 6-24 HRS): SARS Coronavirus 2: NEGATIVE

## 2019-03-23 ENCOUNTER — Other Ambulatory Visit: Payer: Self-pay

## 2019-03-23 ENCOUNTER — Ambulatory Visit (HOSPITAL_BASED_OUTPATIENT_CLINIC_OR_DEPARTMENT_OTHER): Payer: Medicare Other | Admitting: Anesthesiology

## 2019-03-23 ENCOUNTER — Ambulatory Visit (HOSPITAL_BASED_OUTPATIENT_CLINIC_OR_DEPARTMENT_OTHER)
Admission: RE | Admit: 2019-03-23 | Discharge: 2019-03-24 | Disposition: A | Discharge status: Home / Self Care | Payer: Medicare Other | Attending: Otolaryngology | Admitting: Otolaryngology

## 2019-03-23 ENCOUNTER — Encounter (HOSPITAL_BASED_OUTPATIENT_CLINIC_OR_DEPARTMENT_OTHER): Payer: Self-pay | Admitting: Otolaryngology

## 2019-03-23 ENCOUNTER — Encounter (HOSPITAL_BASED_OUTPATIENT_CLINIC_OR_DEPARTMENT_OTHER)
Admission: RE | Disposition: A | Discharge status: Home / Self Care | Payer: Self-pay | Source: Home / Self Care | Attending: Otolaryngology

## 2019-03-23 DIAGNOSIS — K1120 Sialoadenitis, unspecified: Secondary | ICD-10-CM | POA: Diagnosis not present

## 2019-03-23 DIAGNOSIS — Z9049 Acquired absence of other specified parts of digestive tract: Secondary | ICD-10-CM

## 2019-03-23 DIAGNOSIS — K115 Sialolithiasis: Secondary | ICD-10-CM | POA: Diagnosis not present

## 2019-03-23 DIAGNOSIS — Z853 Personal history of malignant neoplasm of breast: Secondary | ICD-10-CM | POA: Insufficient documentation

## 2019-03-23 DIAGNOSIS — I10 Essential (primary) hypertension: Secondary | ICD-10-CM | POA: Insufficient documentation

## 2019-03-23 DIAGNOSIS — C07 Malignant neoplasm of parotid gland: Secondary | ICD-10-CM | POA: Insufficient documentation

## 2019-03-23 DIAGNOSIS — Z9011 Acquired absence of right breast and nipple: Secondary | ICD-10-CM | POA: Insufficient documentation

## 2019-03-23 DIAGNOSIS — Z6839 Body mass index (BMI) 39.0-39.9, adult: Secondary | ICD-10-CM | POA: Insufficient documentation

## 2019-03-23 HISTORY — PX: PAROTIDECTOMY: SHX2163

## 2019-03-23 SURGERY — EXCISION, PAROTID GLAND
Anesthesia: General | Site: Neck | Laterality: Left

## 2019-03-23 MED ORDER — CALCIUM CARB-CHOLECALCIFEROL 1000-800 MG-UNIT PO TABS: ORAL_TABLET | Freq: Every day | ORAL | Status: DC

## 2019-03-23 MED ORDER — MIDAZOLAM HCL 2 MG/2ML IJ SOLN: 0.5000 mg | Freq: Once | INTRAMUSCULAR | Status: DC | PRN

## 2019-03-23 MED ORDER — DEXAMETHASONE SODIUM PHOSPHATE 4 MG/ML IJ SOLN
INTRAMUSCULAR | Status: DC | PRN
  Administered 2019-03-23: 10 mg via INTRAVENOUS

## 2019-03-23 MED ORDER — LIDOCAINE 2% (20 MG/ML) 5 ML SYRINGE
INTRAMUSCULAR | Status: DC | PRN
  Administered 2019-03-23: 60 mg via INTRAVENOUS

## 2019-03-23 MED ORDER — ONDANSETRON HCL 4 MG/2ML IJ SOLN
INTRAMUSCULAR | Status: AC
  Filled 2019-03-23: qty 2

## 2019-03-23 MED ORDER — MIDAZOLAM HCL 2 MG/2ML IJ SOLN: 1.0000 mg | INTRAMUSCULAR | Status: DC | PRN

## 2019-03-23 MED ORDER — FENTANYL CITRATE (PF) 100 MCG/2ML IJ SOLN
INTRAMUSCULAR | Status: AC
  Filled 2019-03-23: qty 2

## 2019-03-23 MED ORDER — ACETAMINOPHEN 500 MG PO TABS
500.0000 mg | ORAL_TABLET | ORAL | Status: DC | PRN
  Administered 2019-03-23 (×2): 500 mg via ORAL
  Filled 2019-03-23 (×2): qty 1

## 2019-03-23 MED ORDER — PHENYLEPHRINE HCL-NACL 10-0.9 MG/250ML-% IV SOLN
INTRAVENOUS | Status: DC | PRN
  Administered 2019-03-23: 20 ug/min via INTRAVENOUS

## 2019-03-23 MED ORDER — DEXAMETHASONE SODIUM PHOSPHATE 10 MG/ML IJ SOLN
INTRAMUSCULAR | Status: AC
  Filled 2019-03-23: qty 1

## 2019-03-23 MED ORDER — PROPOFOL 10 MG/ML IV BOLUS
INTRAVENOUS | Status: DC | PRN
  Administered 2019-03-23: 150 mg via INTRAVENOUS

## 2019-03-23 MED ORDER — FENTANYL CITRATE (PF) 100 MCG/2ML IJ SOLN
25.0000 ug | INTRAMUSCULAR | Status: DC | PRN
  Administered 2019-03-23: 50 ug via INTRAVENOUS

## 2019-03-23 MED ORDER — ZOLPIDEM TARTRATE 5 MG PO TABS: 5.0000 mg | ORAL_TABLET | Freq: Every evening | ORAL | Status: DC | PRN

## 2019-03-23 MED ORDER — LACTATED RINGERS IV SOLN: INTRAVENOUS | Status: DC

## 2019-03-23 MED ORDER — KCL IN DEXTROSE-NACL 20-5-0.45 MEQ/L-%-% IV SOLN
INTRAVENOUS | Status: DC
  Filled 2019-03-23: qty 1000

## 2019-03-23 MED ORDER — SUCCINYLCHOLINE CHLORIDE 200 MG/10ML IV SOSY
PREFILLED_SYRINGE | INTRAVENOUS | Status: AC
  Filled 2019-03-23: qty 10

## 2019-03-23 MED ORDER — EPHEDRINE SULFATE 50 MG/ML IJ SOLN
INTRAMUSCULAR | Status: DC | PRN
  Administered 2019-03-23: 10 mg via INTRAVENOUS

## 2019-03-23 MED ORDER — SUCCINYLCHOLINE CHLORIDE 20 MG/ML IJ SOLN
INTRAMUSCULAR | Status: DC | PRN
  Administered 2019-03-23: 120 mg via INTRAVENOUS

## 2019-03-23 MED ORDER — SCOPOLAMINE 1 MG/3DAYS TD PT72
1.0000 | MEDICATED_PATCH | Freq: Once | TRANSDERMAL | Status: DC
  Administered 2019-03-23: 1.5 mg via TRANSDERMAL

## 2019-03-23 MED ORDER — CEFAZOLIN SODIUM-DEXTROSE 2-3 GM-%(50ML) IV SOLR
INTRAVENOUS | Status: DC | PRN
  Administered 2019-03-23: 2 g via INTRAVENOUS

## 2019-03-23 MED ORDER — FENTANYL CITRATE (PF) 100 MCG/2ML IJ SOLN: 50.0000 ug | INTRAMUSCULAR | Status: DC | PRN

## 2019-03-23 MED ORDER — AMOXICILLIN 875 MG PO TABS: 875.0000 mg | ORAL_TABLET | Freq: Two times a day (BID) | ORAL | 0 refills | Status: AC

## 2019-03-23 MED ORDER — LIDOCAINE-EPINEPHRINE 1 %-1:100000 IJ SOLN
INTRAMUSCULAR | Status: DC | PRN
  Administered 2019-03-23: 4 mL

## 2019-03-23 MED ORDER — FENTANYL CITRATE (PF) 100 MCG/2ML IJ SOLN
INTRAMUSCULAR | Status: DC | PRN
  Administered 2019-03-23 (×2): 50 ug via INTRAVENOUS

## 2019-03-23 MED ORDER — ONDANSETRON HCL 4 MG/2ML IJ SOLN
INTRAMUSCULAR | Status: DC | PRN
  Administered 2019-03-23: 4 mg via INTRAVENOUS

## 2019-03-23 MED ORDER — PHENYLEPHRINE HCL (PRESSORS) 10 MG/ML IV SOLN
INTRAVENOUS | Status: AC
  Filled 2019-03-23: qty 1

## 2019-03-23 MED ORDER — EPHEDRINE SULFATE 50 MG/ML IJ SOLN: INTRAMUSCULAR | Status: DC | PRN

## 2019-03-23 MED ORDER — EPHEDRINE 5 MG/ML INJ
INTRAVENOUS | Status: AC
  Filled 2019-03-23: qty 10

## 2019-03-23 MED ORDER — MELOXICAM 15 MG PO TABS: 15.0000 mg | ORAL_TABLET | Freq: Every day | ORAL | Status: DC

## 2019-03-23 MED ORDER — TRIAMTERENE-HCTZ 37.5-25 MG PO TABS: 1.0000 | ORAL_TABLET | Freq: Every day | ORAL | Status: DC

## 2019-03-23 MED ORDER — LIDOCAINE 2% (20 MG/ML) 5 ML SYRINGE
INTRAMUSCULAR | Status: AC
  Filled 2019-03-23: qty 5

## 2019-03-23 MED ORDER — SCOPOLAMINE 1 MG/3DAYS TD PT72
MEDICATED_PATCH | TRANSDERMAL | Status: AC
  Filled 2019-03-23: qty 1

## 2019-03-23 SURGICAL SUPPLY — 62 items
ADH SKN CLS APL DERMABOND .7 (GAUZE/BANDAGES/DRESSINGS) ×1
APL SRG 3 HI ABS STRL LF PLS (MISCELLANEOUS) ×1
APPLICATOR DR MATTHEWS STRL (MISCELLANEOUS) ×3 IMPLANT
ATTRACTOMAT 16X20 MAGNETIC DRP (DRAPES) IMPLANT
BALL CTTN LRG ABS STRL LF (GAUZE/BANDAGES/DRESSINGS) ×1
BLADE SURG 15 STRL LF DISP TIS (BLADE) ×1 IMPLANT
BLADE SURG 15 STRL SS (BLADE) ×3
CANISTER SUCT 1200ML W/VALVE (MISCELLANEOUS) ×3 IMPLANT
CORD BIPOLAR FORCEPS 12FT (ELECTRODE) ×3 IMPLANT
COTTONBALL LRG STERILE PKG (GAUZE/BANDAGES/DRESSINGS) ×3 IMPLANT
COVER BACK TABLE 60X90IN (DRAPES) ×3 IMPLANT
COVER MAYO STAND STRL (DRAPES) ×3 IMPLANT
COVER WAND RF STERILE (DRAPES) IMPLANT
DECANTER SPIKE VIAL GLASS SM (MISCELLANEOUS) ×1 IMPLANT
DERMABOND ADVANCED (GAUZE/BANDAGES/DRESSINGS) ×2
DERMABOND ADVANCED .7 DNX12 (GAUZE/BANDAGES/DRESSINGS) ×1 IMPLANT
DRAIN CHANNEL 10F 3/8 F FF (DRAIN) ×2 IMPLANT
DRAPE SURG 17X23 STRL (DRAPES) ×2 IMPLANT
DRAPE U-SHAPE 76X120 STRL (DRAPES) ×3 IMPLANT
ELECT COATED BLADE 2.86 ST (ELECTRODE) ×3 IMPLANT
ELECT PAIRED SUBDERMAL (MISCELLANEOUS) ×3
ELECT REM PT RETURN 9FT ADLT (ELECTROSURGICAL) ×3
ELECTRODE PAIRED SUBDERMAL (MISCELLANEOUS) ×1 IMPLANT
ELECTRODE REM PT RTRN 9FT ADLT (ELECTROSURGICAL) ×1 IMPLANT
EVACUATOR SILICONE 100CC (DRAIN) ×2 IMPLANT
FORCEPS BIPOLAR SPETZLER 8 1.0 (NEUROSURGERY SUPPLIES) ×3 IMPLANT
GAUZE 4X4 16PLY RFD (DISPOSABLE) ×3 IMPLANT
GLOVE BIO SURGEON STRL SZ 6.5 (GLOVE) ×1 IMPLANT
GLOVE BIO SURGEON STRL SZ7.5 (GLOVE) ×3 IMPLANT
GLOVE BIO SURGEONS STRL SZ 6.5 (GLOVE) ×1
GLOVE ECLIPSE 6.5 STRL STRAW (GLOVE) ×2 IMPLANT
GOWN STRL REUS W/ TWL LRG LVL3 (GOWN DISPOSABLE) ×2 IMPLANT
GOWN STRL REUS W/TWL LRG LVL3 (GOWN DISPOSABLE) ×9
LOCATOR NERVE 3 VOLT (DISPOSABLE) IMPLANT
NDL HYPO 25X1 1.5 SAFETY (NEEDLE) ×1 IMPLANT
NEEDLE HYPO 25X1 1.5 SAFETY (NEEDLE) ×3 IMPLANT
NS IRRIG 1000ML POUR BTL (IV SOLUTION) ×3 IMPLANT
PACK BASIN DAY SURGERY FS (CUSTOM PROCEDURE TRAY) ×3 IMPLANT
PAD ALCOHOL SWAB (MISCELLANEOUS) ×6 IMPLANT
PENCIL SMOKE EVACUATOR (MISCELLANEOUS) ×3 IMPLANT
PIN SAFETY STERILE (MISCELLANEOUS) IMPLANT
PROBE NERVBE PRASS .33 (MISCELLANEOUS) ×3 IMPLANT
SHEARS HARMONIC 9CM CVD (BLADE) ×3 IMPLANT
SLEEVE SCD COMPRESS KNEE MED (MISCELLANEOUS) ×3 IMPLANT
SPONGE INTESTINAL PEANUT (DISPOSABLE) ×7 IMPLANT
STAPLER VISISTAT 35W (STAPLE) IMPLANT
SUT ETHILON 3 0 PS 1 (SUTURE) ×2 IMPLANT
SUT PROLENE 5 0 PS 2 (SUTURE) ×3 IMPLANT
SUT SILK 2 0 PERMA HAND 18 BK (SUTURE) ×9 IMPLANT
SUT SILK 2 0 TIES 17X18 (SUTURE)
SUT SILK 2-0 18XBRD TIE BLK (SUTURE) IMPLANT
SUT SILK 3 0 TIES 17X18 (SUTURE) ×3
SUT SILK 3-0 18XBRD TIE BLK (SUTURE) ×1 IMPLANT
SUT SILK 4 0 TIES 17X18 (SUTURE) IMPLANT
SUT VIC AB 3-0 FS2 27 (SUTURE) IMPLANT
SUT VICRYL 4-0 PS2 18IN ABS (SUTURE) ×3 IMPLANT
SYR BULB 3OZ (MISCELLANEOUS) ×3 IMPLANT
SYR CONTROL 10ML LL (SYRINGE) ×3 IMPLANT
TOWEL GREEN STERILE FF (TOWEL DISPOSABLE) ×3 IMPLANT
TRAY DSU PREP LF (CUSTOM PROCEDURE TRAY) ×3 IMPLANT
TUBE CONNECTING 20'X1/4 (TUBING) ×1
TUBE CONNECTING 20X1/4 (TUBING) ×2 IMPLANT

## 2019-03-23 NOTE — Transfer of Care (Signed)
Immediate Anesthesia Transfer of Care Note  Patient: Jenna Jordan  Procedure(s) Performed: LEFT TOTAL PAROTIDECTOMY (Left Neck)  Patient Location: PACU  Anesthesia Type:General  Level of Consciousness: awake  Airway & Oxygen Therapy: Patient Spontanous Breathing and Patient connected to face mask oxygen  Post-op Assessment: Report given to RN and Post -op Vital signs reviewed and stable  Post vital signs: Reviewed and stable  Last Vitals:  Vitals Value Taken Time  BP 114/56 03/23/19 1141  Temp    Pulse 87 03/23/19 1143  Resp 22 03/23/19 1143  SpO2 97 % 03/23/19 1143  Vitals shown include unvalidated device data.  Last Pain:  Vitals:   03/23/19 0743  TempSrc: Tympanic  PainSc: 0-No pain         Complications: No apparent anesthesia complications

## 2019-03-23 NOTE — Anesthesia Postprocedure Evaluation (Signed)
Anesthesia Post Note  Patient: Jenna Jordan  Procedure(s) Performed: LEFT TOTAL PAROTIDECTOMY (Left Neck)     Patient location during evaluation: PACU Anesthesia Type: General Level of consciousness: awake and alert, patient cooperative and oriented Pain management: pain level controlled Vital Signs Assessment: post-procedure vital signs reviewed and stable Respiratory status: spontaneous breathing, nonlabored ventilation and respiratory function stable Cardiovascular status: blood pressure returned to baseline and stable Postop Assessment: no apparent nausea or vomiting Anesthetic complications: no    Last Vitals:  Vitals:   03/23/19 1230 03/23/19 1245  BP: (!) 118/55 (!) 112/56  Pulse: 85 86  Resp: (!) 27 18  Temp:    SpO2: 94% 94%    Last Pain:  Vitals:   03/23/19 1245  TempSrc:   PainSc: 3                  Nashalie Sallis,E. Teniyah Seivert

## 2019-03-23 NOTE — Op Note (Signed)
DATE OF PROCEDURE:  03/23/2019                              OPERATIVE REPORT  SURGEON:  Leta Baptist, MD  PREOPERATIVE DIAGNOSES: 1. Left parotid mass.  POSTOPERATIVE DIAGNOSIS: 1. Left parotid mass.  PROCEDURE PERFORMED:  Left total parotidectomy with facial nerve dissection and preservation.  ANESTHESIA:  General endotracheal tube anesthesia.  COMPLICATIONS:  None.  ESTIMATED BLOOD LOSS:  100 ml.  INDICATION FOR PROCEDURE:  Caralyn Becket is a 79 y.o. female with a history of an enlarging left facial mass. The patient first noticed a left facial mass 3 months ago. The patient underwent a CT which showed a 2 cm left parotid mass with mild inflammation. The mass was rapidly getting larger. The patient was treated with a course of antibiotics with no significant change in the size of the mass. The patient denied any facial weakness or numbness.  Based on the above findings, the decision was made for the patient to undergo the excision procedure.  The risks, benefits, alternatives, and details of the procedure were discussed with the patient.  Questions were invited and answered.  Informed consent was obtained.  DESCRIPTION:  The patient was taken to the operating room and placed supine on the operating table.  General endotracheal tube anesthesia was administered by the anesthesiologist.  The patient was positioned and prepped and draped in a standard fashion for left parotidectomy surgery.  Facial nerve monitoring electrodes were placed.  The facial nerve monitoring system was functional throughout the case.  1% lidocaine with 1-100,000 epinephrine was infiltrated at the planned site of incision.  A standard facelift incision was made on the right side.  The incision was carried down to the level of the SMAS layer. The SMAS flap was elevated in the standard fashion.  Careful dissection was carried out anterior to the left auricular cartilage.  The dissection was carried down to the level of the  facial nerve.  The main trunk of the facial nerve and the superior and inferior branches were all dissected free from the parotid tissue.  All branches were noted to be functional throughout the case. The nearly 4cm mass was noted to have a large portion deep to the facial nerve. The branches of the facial nerve was then dissected free from the parotid tumor.  The entire tumor was removed and sent to the pathology department for permanent histologic identification.  The surgical site was copiously irrigated.  A #10 JP drain was placed.  The incision was closed in layers with 4-0 Vicryl, 5-0 Prolene, and Dermabond.  The care of the patient was turned over to the anesthesiologist.  The patient was awakened from anesthesia without difficulty.  The patient was extubated and transferred to the recovery room in good condition.  OPERATIVE FINDINGS:  A 4 cm right parotid mass, with a large portion deep to the facial nerve.  SPECIMEN: Right parotid mass.  FOLLOWUP CARE:  The patient will be observed overnight.  The patient will follow up in my office in approximately 1 week. Trevel Dillenbeck W Antonius Hartlage 03/23/2019 11:47 AM

## 2019-03-23 NOTE — Anesthesia Procedure Notes (Signed)
Procedure Name: Intubation Date/Time: 03/23/2019 8:55 AM Performed by: Lieutenant Diego, CRNA Pre-anesthesia Checklist: Patient identified, Emergency Drugs available, Suction available and Patient being monitored Patient Re-evaluated:Patient Re-evaluated prior to induction Oxygen Delivery Method: Circle system utilized Preoxygenation: Pre-oxygenation with 100% oxygen Induction Type: IV induction Ventilation: Mask ventilation without difficulty Laryngoscope Size: Miller, 2 and Glidescope Grade View: Grade II Tube type: Oral Tube size: 7.0 mm Number of attempts: 2 Airway Equipment and Method: Stylet,  Oral airway and Video-laryngoscopy Placement Confirmation: ETT inserted through vocal cords under direct vision,  positive ETCO2 and breath sounds checked- equal and bilateral Secured at: 23 cm Tube secured with: Tape Dental Injury: Teeth and Oropharynx as per pre-operative assessment  Comments: DL miller 2, grade 3 view. DL glide, ETT passes thru cords without trauma

## 2019-03-23 NOTE — H&P (Signed)
Cc: Left parotid mass  HPI: The patient is a 79 y/o female who presents today for evaluation of a left facial mass. The patient is seen in consultation requested by Dr. Willey Blade. The patient first noticed a left facial mass 3 months ago. The area is getting larger. The patient underwent a CT which showed a 2 cm left parotid mass with mild inflammation. The patient was treated with a course of antibiotics with no significant change in the size of the mass. The patient denies any facial weakness or numbness. She has no previous history of ENT surgery.   The patient's review of systems (constitutional, eyes, ENT, cardiovascular, respiratory, GI, musculoskeletal, skin, neurologic, psychiatric, endocrine, hematologic, allergic) is noted in the ROS questionnaire.  It is reviewed with the patient.   Family health history: Heart disease, hearing loss.  Major events: Left breast lumpectomy, right breast mastectomy, hernia repair, hysterectomy,.  Ongoing medical problems: Breast cancer, arthritis, hypertension, cataracts.  Social history: The patient is a widow. She denies the use of tobacco, alcohol or illegal drugs.   Exam: General: Communicates without difficulty, well nourished, no acute distress. Head: Normocephalic, no evidence injury, no tenderness, facial buttresses intact without stepoff. Eyes: PERRL, EOMI. No scleral icterus, conjunctivae clear. Neuro: CN II exam reveals vision grossly intact.  No nystagmus at any point of gaze. Ears: Auricles well formed without lesions.  Ear canals are intact without mass or lesion.  No erythema or edema is appreciated.  The TMs are intact without fluid. Nose: External evaluation reveals normal support and skin without lesions.  Dorsum is intact.  Anterior rhinoscopy reveals healthy pink mucosa over anterior aspect of inferior turbinates and intact septum.  No purulence noted. Oral:  Oral cavity and oropharynx are intact, symmetric, without erythema or edema.   Mucosa is moist without lesions. Neck: Full range of motion without pain.  There is no significant lymphadenopathy.  No masses palpable.  Thyroid bed within normal limits to palpation. Submandibular glands equal bilaterally without mass.  A >2cm left parotid mass is noted.  Trachea is midline.   Assessment The patient is noted to have a 2+ cm left parotid mass.   Plan  1. The physical exam and CT findings are reviewed with the patient.  2. In light of the increasing size of the mass, a left parotidectomy is recommended. The risks, benefits, alternatives, and details of the procedure are extensively reviewed with the patient. Questions are invited and answered. 3. The patient is interested in proceeding with the procedure.  We will schedule the procedure in accordance with the family schedule.

## 2019-03-23 NOTE — Discharge Instructions (Addendum)
Parotidectomy, Care After This sheet gives you information about how to care for yourself after your procedure. Your health care provider may also give you more specific instructions. If you have problems or questions, contact your health care provider. What can I expect after the procedure? After the procedure, it is common to have:  Pain and mild swelling at the incision site.  Numbness along the incision.  Numbness in part or all of your ear.  Mild jaw discomfort on the surgical side when you are eating or chewing. This may last up to 2-4 weeks. Follow these instructions at home: Medicines   Take over-the-counter and prescription medicines only as told by your health care provider.  If you were prescribed an antibiotic medicine, take it as told by your health care provider. Do not stop taking the antibiotic even if you start to feel better.  Ask your health care provider if the medicine prescribed to you: ? Requires you to avoid driving or using heavy machinery. ? Can cause constipation. You may need to take actions to prevent or treat constipation, such as:  Drink enough fluid to keep your urine pale yellow.  Take over-the-counter or prescription medicines.  Eat foods that are high in fiber, such as beans, whole grains, and fresh fruits and vegetables.  Limit foods that are high in fat and processed sugars, such as fried or sweet foods. Incision care   Follow instructions from your health care provider about how to take care of your incision. Make sure you: ? Wash your hands with soap and water before and after you change your bandage (dressing). If soap and water are not available, use hand sanitizer. ? Change your dressing as told by your health care provider. ? Leave stitches (sutures), skin glue, or adhesive strips in place. These skin closures may need to be in place for 2 weeks or longer. If adhesive strip edges start to loosen and curl up, you may trim the loose edges.  Do not remove adhesive strips completely unless your health care provider tells you to do that.  Check your incision area every day for signs of infection. Check for: ? More redness, swelling, or pain. ? Fluid or blood. ? Warmth. ? Pus or a bad smell.  Follow your health care provider's instructions about cleaning and maintaining the drain that was placed near your incision. Eating and drinking  Follow instructions from your health care provider about eating or drinking restrictions.  If your mouth or jaw is sore, try eating soft foods until you feel better. Activity  Return to your normal activities as told by your health care provider. Ask your health care provider what activities are safe for you.  Rest as told by your health care provider.  Avoid sitting for a long time without moving. Get up to take short walks every 1-2 hours. This is important to improve blood flow and breathing. Ask for help if you feel weak or unsteady.  Do not lift anything that is heavier than 20 lb, or the limit that you are told, until your health care provider says that it is safe. General instructions  Keep your head raised (elevated) when you lie down during the first few weeks after surgery. This will help prevent increased swelling.  Keep all follow-up visits as told by your health care provider. This is important. Contact a health care provider if:  You have pain that does not get better with medicine.  You have more redness, swelling, or  pain around your incision.  You have fluid or blood coming from your incision.  Your incision feels warm to the touch.  You have pus or a bad smell coming from your incision.  You vomit or feel nauseous.  You have a fever. Get help right away if:  You have more pain, swelling, or redness that suddenly gets worse at the incision site.  You have increasing numbness or weakness in your face.  You have severe pain. Summary  After the procedure, it  is common to have mild jaw discomfort on the surgical side when you are eating or chewing. This may last up to 2-4 weeks.  Follow instructions from your health care provider about how to take care of your incision.  If your mouth or jaw is sore, try eating soft foods until you feel better.  Return to your normal activities as told by your health care provider. Ask your health care provider what activities are safe for you. This information is not intended to replace advice given to you by your health care provider. Make sure you discuss any questions you have with your health care provider. Document Revised: 12/01/2017 Document Reviewed: 12/03/2017 Elsevier Patient Education  [place] Instructions  Activity: Get plenty of rest for the remainder of the day. A responsible individual must stay with you for 24 hours following the procedure.  For the next 24 hours, DO NOT: -Drive a car -Paediatric nurse -Drink alcoholic beverages -Take any medication unless instructed by your physician -Make any legal decisions or sign important papers.  Meals: Start with liquid foods such as gelatin or soup. Progress to regular foods as tolerated. Avoid greasy, spicy, heavy foods. If nausea and/or vomiting occur, drink only clear liquids until the nausea and/or vomiting subsides. Call your physician if vomiting continues.  Special Instructions/Symptoms: Your throat may feel dry or sore from the anesthesia or the breathing tube placed in your throat during surgery. If this causes discomfort, gargle with warm salt water. The discomfort should disappear within 24 hours.  If you had a scopolamine patch placed behind your ear for the management of post- operative nausea and/or vomiting:  1. The medication in the patch is effective for 72 hours, after which it should be removed.  Wrap patch in a tissue and discard in the trash. Wash hands thoroughly with soap and  water. 2. You may remove the patch earlier than 72 hours if you experience unpleasant side effects which may include dry mouth, dizziness or visual disturbances. 3. Avoid touching the patch. Wash your hands with soap and water after contact with the patch.

## 2019-03-23 NOTE — Anesthesia Preprocedure Evaluation (Addendum)
Anesthesia Evaluation  Patient identified by MRN, date of birth, ID band Patient awake    Reviewed: Allergy & Precautions, NPO status , Patient's Chart, lab work & pertinent test results  History of Anesthesia Complications (+) PONV  Airway Mallampati: I  TM Distance: >3 FB Neck ROM: Full    Dental  (+) Dental Advisory Given, Chipped   Pulmonary neg pulmonary ROS,  03/19/2019 SARS coronavirus NEG   breath sounds clear to auscultation       Cardiovascular hypertension, Pt. on medications (-) angina Rhythm:Regular Rate:Normal     Neuro/Psych negative neurological ROS     GI/Hepatic negative GI ROS, Neg liver ROS,   Endo/Other  Morbid obesity  Renal/GU negative Renal ROS     Musculoskeletal   Abdominal (+) + obese,   Peds  Hematology negative hematology ROS (+)   Anesthesia Other Findings Breast cancer  Reproductive/Obstetrics negative OB ROS S/p hysterectomy                            Anesthesia Physical Anesthesia Plan  ASA: III  Anesthesia Plan: General   Post-op Pain Management:    Induction: Intravenous  PONV Risk Score and Plan: 4 or greater and Scopolamine patch - Pre-op, Dexamethasone and Ondansetron  Airway Management Planned: Oral ETT  Additional Equipment:   Intra-op Plan:   Post-operative Plan: Extubation in OR  Informed Consent: I have reviewed the patients History and Physical, chart, labs and discussed the procedure including the risks, benefits and alternatives for the proposed anesthesia with the patient or authorized representative who has indicated his/her understanding and acceptance.     Dental advisory given  Plan Discussed with: CRNA and Surgeon  Anesthesia Plan Comments:        Anesthesia Quick Evaluation

## 2019-03-24 ENCOUNTER — Encounter: Payer: Self-pay | Admitting: *Deleted

## 2019-03-24 NOTE — Discharge Summary (Signed)
Physician Discharge Summary  Patient ID: Jenna Jordan MRN: [id number] DOB/AGE: 77-19-1942 77 y.o.  Admit date: 03/23/2019 Discharge date: 03/24/2019  Admission Diagnoses: Left parotid mass  Discharge Diagnoses: Left parotid mass Active Problems:   History of parotidectomy   Discharged Condition: good.  Hospital Course: Pt had an uneventful overnight stay. Pt tolerated po well. No bleeding. No stridor. No facial nerve weakness.  Consults: None  Significant Diagnostic Studies: none  Treatments: surgery: Left parotidectomy  Discharge Exam: Blood pressure (!) 108/56, pulse 68, temperature 98.1 F (36.7 C), resp. rate 18, height 5\' 6"  (1.676 m), weight 110.4 kg, SpO2 94 %. Incision/Wound:c/d/i Facial nerve function intact.  Disposition: Discharge disposition: 01-Home or Self Care       Discharge Instructions    Activity as tolerated - No restrictions   Complete by: As directed    Diet general   Complete by: As directed      Allergies as of 03/24/2019      Reactions   Ibuprofen Other (See Comments)   Oxycodone-acetaminophen Nausea And Vomiting   Statins Other (See Comments)   Codeine Nausea Only   Demerol Nausea And Vomiting   Morphine And Related Other (See Comments)   flushing   Promethazine Hcl Other (See Comments)   Confusion and disorientation   Septra [bactrim] Hives   Tramadol Other (See Comments)   "I flush" , can take with Benadryl      Medication List    STOP taking these medications   aspirin 81 MG tablet     TAKE these medications   amoxicillin 875 MG tablet Commonly known as: AMOXIL Take 1 tablet (875 mg total) by mouth 2 (two) times daily for 3 days.   b complex vitamins tablet Take 1 tablet by mouth daily.   CALCIUM 1000 + D PO Take 1 capsule by mouth daily.   fish oil-omega-3 fatty acids 1000 MG capsule Take 2 g by mouth daily.   lactobacillus acidophilus Tabs tablet Take 2 tablets by mouth daily.   meloxicam 15 MG  tablet Commonly known as: MOBIC Take 15 mg by mouth daily.   triamterene-hydrochlorothiazide 37.5-25 MG tablet Commonly known as: MAXZIDE-25 Take 1 tablet by mouth daily.   TYLENOL 500 MG tablet Generic drug: acetaminophen Take 500 mg by mouth every 6 (six) hours as needed.   vitamin C 1000 MG tablet Take 1,000 mg by mouth daily.   Vitamin D3 25 MCG (1000 UT) Caps Take 1 capsule by mouth daily.      Follow-up Information    Leta Baptist, MD On 03/30/2019.   Specialty: Otolaryngology Why: at [employer] information: [place] [id number] [phone]           Signed: Burley Saver 03/24/2019, 7:42 AM

## 2019-04-08 ENCOUNTER — Encounter (HOSPITAL_COMMUNITY): Payer: Self-pay

## 2019-04-08 LAB — SURGICAL PATHOLOGY

## 2019-04-13 ENCOUNTER — Telehealth: Payer: Self-pay | Admitting: *Deleted

## 2019-04-13 NOTE — Telephone Encounter (Signed)
Oncology Nurse Navigator Documentation  Placed new referral introductory call to Ms. Sipe, [facility] asking for return call.  Gayleen Orem, RN, BSN Head & Neck Oncology Nurse [place] at [place] [phone]

## 2019-04-14 ENCOUNTER — Telehealth: Payer: Self-pay | Admitting: *Deleted

## 2019-04-14 NOTE — Telephone Encounter (Addendum)
Oncology Nurse Navigator Documentation  Jenna Jordan returned my introductory call/VMM from yesterday.  Introduced myself as the H&N oncology nurse navigator that works with Drs. Isidore Moos and Maylon Peppers to whom she has been referred by Dr. Benjamine Mola for discussion of adjuvant treatment s/p 03/23/2019 L t'tl parotidectomy.  She confirmed understanding of referral.  Briefly explained my role as her navigator, provided my contact information.   Confirmed understanding of upcoming appts and [place] location, explained arrival and registration process.  I explained adjuvant chemotherapy not needed per discussion at this morning's ENT Conference but Dr. Maylon Peppers willing to meet with her if she is interested in additional discussion.  She stated she feels comfortable cancelling appt, can ask Dr. Isidore Moos questions during [facility].   I explained the purpose of a dental evaluation prior to starting RT, indicated she may be contacted by [facility] to arrange an appt.   pending further discussion with Dr. Isidore Moos.    I encouraged her to call me with questions/concerns prior to [facility] appt with Dr. Isidore Moos.  She agreed to do so.  Navigator Interventions  Informed Dr. Maylon Peppers [name] declined appt, he acknowledged, I cancelled appt.  IB referral sent to Dr. Enrique Sack  Navigator Initial Assessment . Employment Status: retired Therapist, sports . Currently on FMLA / STD: no . Living Situation: lives in own home, caregiver for adult son who has Down Syndrome. . Support System: dtr lives nearby  . PCP: yes, Dr. Heath Gold . PCD: yes, sees minimally q6 months for check-up/cleaning . Financial Concerns: no . Transportation Needs: no . Sensory Deficits: no . Language Barriers/Interpreter Needed:  no . Ambulation Needs: no . DME Used in Home: no . Psychosocial Needs:  no . Concerns/Needs Understanding Cancer:  addressed/answered by navigator to best of ability . Self-Expressed Needs: no  Gayleen Orem, RN, BSN Head & Neck Oncology  Nurse [place] at [place] [phone]

## 2019-04-15 NOTE — Progress Notes (Signed)
Head and Neck Cancer Location of Tumor / Histology:  03/23/19 FINAL MICROSCOPIC DIAGNOSIS: A. PAROTID GLAND, LEFT, PAROTIDECTOMY: - A. 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 - B. No angiolymphatic or perineural invasion. - C. 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 - Outside consultation diagnosis.  Patient presented with symptoms of: Jenna Jordan first noted a left facial mass about 4 months ago.   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   Nutrition Status Yes No Comments  Weight changes? []  [x]    Swallowing concerns? []  [x]    PEG? []  [x]     Referrals Yes No Comments  Social Work? []  [x]    Dentistry? []  [x]    Swallowing therapy? []  [x]    Nutrition? []  [x]    Med/Onc? []  [x]     Safety Issues Yes No Comments  Prior radiation? [x]  []  Right breast radiation 2005 (Jenna Jordan), Left Breast Radiation 1998 (Jenna Jordan)  Pacemaker/ICD? []  [x]    Possible current pregnancy? []  [x]    Is the patient on methotrexate? []  [x]     Tobacco/Marijuana/Snuff/ETOH use: She has never smoked. She does not drink alcohol.   Past/Anticipated interventions by otolaryngology, if any:  03/23/19 Dr. Benjamine Mola PROCEDURE PERFORMED:Left total parotidectomy with facial nerve dissection and preservation  Past/Anticipated interventions by medical oncology, if any:  N/A   Current Complaints / other details:

## 2019-04-19 ENCOUNTER — Other Ambulatory Visit: Payer: Self-pay

## 2019-04-19 ENCOUNTER — Encounter: Payer: Self-pay | Admitting: *Deleted

## 2019-04-19 ENCOUNTER — Ambulatory Visit
Admission: RE | Admit: 2019-04-19 | Discharge: 2019-04-19 | Disposition: A | Discharge status: Home / Self Care | Payer: Medicare Other | Source: Ambulatory Visit | Attending: Radiation Oncology | Admitting: Radiation Oncology

## 2019-04-19 ENCOUNTER — Encounter: Payer: Self-pay | Admitting: Radiation Oncology

## 2019-04-19 DIAGNOSIS — C07 Malignant neoplasm of parotid gland: Secondary | ICD-10-CM

## 2019-04-19 NOTE — Progress Notes (Signed)
Oncology Nurse Navigator Documentation  Joined Jenna Jordan during Teleconsult with Dr. Isidore Moos.  She was referred by ENT [facility] to discuss adjuvant RT s/p 03/23/2019 L t'tl parotidectomy. She voiced understanding of:  Proposed plan for 6 weeks M-F XRT, to start within 6 weeks of surgery.  Referral to Dentistry for SPD, oral hygiene guidance.  CT SIM s/p fitting of SPD. I encouraged her to call me with questions/concerns moving forward.  She voiced agreement.  Jenna Orem, RN, BSN Head & Neck Oncology Nurse [place] at [place] [phone]
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
## 2019-04-20 ENCOUNTER — Ambulatory Visit: Payer: Medicare Other | Admitting: Hematology

## 2019-04-20 ENCOUNTER — Other Ambulatory Visit: Payer: Medicare Other

## 2019-04-20 NOTE — Progress Notes (Signed)
Dental Form with Estimates of Radiation Dose      Diagnosis: Left parotid carcinoma  Prognosis: curative  Anticipated # of fractions: 30    Daily?: yes  # of weeks of radiotherapy: 6  Chemotherapy?: no  Anticipated xerostomia:  Mild permanent    Pre-simulation needs:   Scatter protection   Simulation: ASAP    Other Notes:  Please contact Eppie Gibson, MD, with patient's disposition after evaluation and/or dental treatment.

## 2019-04-21 ENCOUNTER — Ambulatory Visit (HOSPITAL_COMMUNITY): Payer: Self-pay | Admitting: Dentistry

## 2019-04-21 ENCOUNTER — Encounter (HOSPITAL_COMMUNITY): Payer: Self-pay | Admitting: Dentistry

## 2019-04-21 ENCOUNTER — Other Ambulatory Visit: Payer: Self-pay

## 2019-04-21 VITALS — BP 127/60 | HR 86 | Temp 98.7°F

## 2019-04-21 DIAGNOSIS — K0889 Other specified disorders of teeth and supporting structures: Secondary | ICD-10-CM

## 2019-04-21 DIAGNOSIS — K036 Deposits [accretions] on teeth: Secondary | ICD-10-CM

## 2019-04-21 DIAGNOSIS — K031 Abrasion of teeth: Secondary | ICD-10-CM

## 2019-04-21 DIAGNOSIS — Z01818 Encounter for other preprocedural examination: Secondary | ICD-10-CM | POA: Diagnosis not present

## 2019-04-21 DIAGNOSIS — K08409 Partial loss of teeth, unspecified cause, unspecified class: Secondary | ICD-10-CM

## 2019-04-21 DIAGNOSIS — C07 Malignant neoplasm of parotid gland: Secondary | ICD-10-CM

## 2019-04-21 DIAGNOSIS — K06010 Localized gingival recession, unspecified: Secondary | ICD-10-CM

## 2019-04-21 DIAGNOSIS — K117 Disturbances of salivary secretion: Secondary | ICD-10-CM

## 2019-04-21 DIAGNOSIS — M264 Malocclusion, unspecified: Secondary | ICD-10-CM

## 2019-04-21 DIAGNOSIS — M270 Developmental disorders of jaws: Secondary | ICD-10-CM

## 2019-04-21 DIAGNOSIS — K0530 Chronic periodontitis, unspecified: Secondary | ICD-10-CM

## 2019-04-21 DIAGNOSIS — L818 Other specified disorders of pigmentation: Secondary | ICD-10-CM

## 2019-04-21 DIAGNOSIS — K030 Excessive attrition of teeth: Secondary | ICD-10-CM

## 2019-04-21 MED ORDER — SODIUM FLUORIDE 1.1 % DT CREA: TOPICAL_CREAM | DENTAL | 99 refills | Status: AC
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
## 2019-04-21 NOTE — Patient Instructions (Addendum)

## 2019-04-23 ENCOUNTER — Encounter (HOSPITAL_COMMUNITY): Payer: Self-pay | Admitting: Dentistry

## 2019-04-23 ENCOUNTER — Ambulatory Visit (HOSPITAL_COMMUNITY): Payer: Medicare Other | Admitting: Dentistry

## 2019-04-23 ENCOUNTER — Other Ambulatory Visit: Payer: Self-pay | Admitting: Radiation Oncology

## 2019-04-23 ENCOUNTER — Encounter: Payer: Self-pay | Admitting: Radiation Oncology

## 2019-04-23 ENCOUNTER — Telehealth: Payer: Self-pay | Admitting: *Deleted

## 2019-04-23 ENCOUNTER — Other Ambulatory Visit: Payer: Self-pay

## 2019-04-23 VITALS — BP 137/56 | HR 73 | Temp 98.6°F

## 2019-04-23 DIAGNOSIS — Z01818 Encounter for other preprocedural examination: Secondary | ICD-10-CM

## 2019-04-23 DIAGNOSIS — Z1329 Encounter for screening for other suspected endocrine disorder: Secondary | ICD-10-CM

## 2019-04-23 DIAGNOSIS — C07 Malignant neoplasm of parotid gland: Secondary | ICD-10-CM

## 2019-04-23 DIAGNOSIS — Z463 Encounter for fitting and adjustment of dental prosthetic device: Secondary | ICD-10-CM

## 2019-04-23 DIAGNOSIS — R5381 Other malaise: Secondary | ICD-10-CM

## 2019-04-23 NOTE — Progress Notes (Signed)
Has armband been applied?  Yes  Does patient have an allergy to IV contrast dye?: No   Has patient ever received premedication for IV contrast dye?: N/A  Does patient take metformin?: No  If patient does take metformin when was the last dose: N/A  Date of lab work: 03/19/19 BUN: 17 CR: 0.86 EGFR:  >60  IV site: Right AC  Has IV site been added to flowsheet?  Yes  Temp 98.6, HR 73, BP 137/56

## 2019-04-23 NOTE — Patient Instructions (Signed)
COVID-19 Education: The signs and symptoms of COVID-19 were discussed with the patient and how to seek care for testing (follow up with PCP or arrange E-visit).   The importance of social distancing was discussed today.  COVID-19 Vaccine Information can be found at: [email or web address].uk For questions related to vaccine distribution or appointments, please email [email or web address] or call [phone].   TRISMUS  Trismus is a condition where the jaw does not allow the mouth to open as wide as it usually does.  This can happen almost suddenly, or in other cases the process is so slow, it is hard to notice it-until it is too far along.  When the jaw joints and/or muscles have been exposed to radiation treatments, the onset of Trismus is very slow.  This is because the muscles are losing their stretching ability over a long period of time, as long as 2 YEARS after the end of radiation.  It is therefore important to exercise these muscles and joints.  TRISMUS EXERCISES   Stack of tongue depressors measuring the same or a little less than the last documented MIO (Maximum Interincisal Opening).  Secure them with a rubber band on both ends.  Place the stack in the patient's mouth, supporting the other end.  Allow 30 seconds for muscle stretching.  Rest for a few seconds.  Repeat 3-5 times  For all radiation patients, this exercise is recommended in the mornings and evenings unless otherwise instructed.  The exercise should be done for a period of 2 YEARS after the end of radiation.  MIO should be checked routinely on recall dental visits by the general dentist or the hospital dentist.  The patient is advised to report any changes, soreness, or difficulties encountered when doing the exercises.

## 2019-04-23 NOTE — Progress Notes (Signed)
04/23/2019  Patient Name:   Jenna Jordan Date of Birth:   [birth year] Medical Record Number: [id number]  COVID 19 SCREENING: The patient does not symptoms concerning for COVID-19 infection (Including fever, chills, cough, or new SHORTNESS OF BREATH).   BP (!) 137/56 (BP Location: Right Arm)   Pulse 73   Temp 98.6 F (37 C)   Jenna Jordan now presents for insertion of upper and lower scatter protection devices.  PROCEDURE: Appliances were tried in and adjusted as needed. [place] Trismus device was previously fabricated at 40 mm using 21 sticks. Postop instructions were provided and a written and verbal format concerning the use and care of appliances. All questions were answered. Patient indicates that she has NOTpicked up the fluoride therapy and is to use at bedtime as instructed on her toothbrush. Patient will pick up today. Patient to call for oral examination in 2 to 3 weeks if so desired.  Otherwise, the patient will be seen approximately 1 month after the last radiation therapy has been completed.  Patient may also call if problems arise before then.     Lenn Cal, DDS

## 2019-04-23 NOTE — Telephone Encounter (Signed)
Oncology Nurse Navigator Documentation  Spoke with Jenna Jordan, informed her of 3/2 CT SIM starting with 9:15 IV Start followed by [facility].  Encouraged 9:00 arrival for registration.  She voiced understanding.  Gayleen Orem, RN, BSN Head & Neck Oncology Nurse [place] at [place] [phone]

## 2019-04-26 ENCOUNTER — Telehealth: Payer: Self-pay | Admitting: Radiation Oncology

## 2019-04-26 ENCOUNTER — Telehealth: Payer: Self-pay | Admitting: *Deleted

## 2019-04-26 ENCOUNTER — Telehealth: Payer: Self-pay

## 2019-04-26 NOTE — Telephone Encounter (Signed)
Contacted patient to verify telephone visit for pre reg °

## 2019-04-26 NOTE — Telephone Encounter (Signed)
CALLED PATIENT TO INFORM OF LAB FOR 04-27-19 @ 11 AM, SPOKE WITH PATIENT AND SHE IS AWARE OF THIS APPT.

## 2019-04-26 NOTE — Telephone Encounter (Signed)
Scheduled appt per 2/27 sch msg - left message with appt date and time

## 2019-04-27 ENCOUNTER — Other Ambulatory Visit: Payer: Self-pay

## 2019-04-27 ENCOUNTER — Ambulatory Visit
Admission: RE | Admit: 2019-04-27 | Discharge: 2019-04-27 | Disposition: A | Discharge status: Home / Self Care | Payer: Medicare Other | Source: Ambulatory Visit | Attending: Radiation Oncology | Admitting: Radiation Oncology

## 2019-04-27 ENCOUNTER — Encounter: Payer: Self-pay | Admitting: General Practice

## 2019-04-27 ENCOUNTER — Encounter: Payer: Self-pay | Admitting: *Deleted

## 2019-04-27 DIAGNOSIS — C07 Malignant neoplasm of parotid gland: Secondary | ICD-10-CM | POA: Diagnosis present

## 2019-04-27 DIAGNOSIS — Z1329 Encounter for screening for other suspected endocrine disorder: Secondary | ICD-10-CM

## 2019-04-27 DIAGNOSIS — Z510 Encounter for antineoplastic radiation therapy: Secondary | ICD-10-CM | POA: Insufficient documentation

## 2019-04-27 DIAGNOSIS — R5381 Other malaise: Secondary | ICD-10-CM

## 2019-04-27 LAB — TSH: TSH: 2.024 u[IU]/mL (ref 0.308–3.960)

## 2019-04-27 MED ORDER — SODIUM CHLORIDE 0.9% FLUSH
10.0000 mL | Freq: Once | INTRAVENOUS | Status: AC
  Administered 2019-04-27: 10 mL via INTRAVENOUS

## 2019-04-27 NOTE — Progress Notes (Signed)
Oncology Nurse Navigator Documentation  To provide support, encouragement and care continuity, met with Ms. Calmes during her CT [facility].  Marland Kitchen Prior to [facility], provided New Patient Information packet, discussed contents: o Contact information for physician(s), myself, other members of the Care Team. o Advance Directive information ([place] blue pamphlet with LCSW contact info).  She stated she has ADs, will bring in to scan. o Fall Prevention Patient [place] campus map with highlight of [place] sheet o Symptom Management Clinic information  She tolerated procedure without difficulty, denied questions/concerns.    I toured her to [facility] 4 treatment area, explained procedures for lobby registration, arrival to Radiation Waiting, arrival to tmt area and preparation for tmt.  She voiced understanding.    I encouraged her to call me with questions/concerns prior to 3/10 New Start.  She agreed to do so.  Gayleen Orem, RN, BSN Head & Neck Oncology Nurse [place] at [place] [phone]

## 2019-04-27 NOTE — Progress Notes (Signed)
[place] CSW Progress Notes  Referral received from Radiation Oncologist, requested to call patient to offer social support and resources as needed.  Called patient, unable to reach, left VM w my contact information and encouragement to call back when able.  Beverely Pace, [place], [place] Social Worker Phone:  [phone] Cell:  [phone]

## 2019-04-28 ENCOUNTER — Encounter: Payer: Self-pay | Admitting: General Practice

## 2019-04-28 ENCOUNTER — Inpatient Hospital Stay: Payer: Medicare Other | Attending: Radiation Oncology

## 2019-04-28 NOTE — Progress Notes (Signed)
[place] Initial Psychosocial Assessment Clinical Social Work  Clinical Social Work contacted by phone to assess psychosocial, emotional, mental health, and spiritual needs of the patient.   Barriers to care/review of distress screen:  - Transportation:  Do you anticipate any problems getting to appointments?  Do you have someone who can help run errands for you if you need it?  No concerns - Help at home:  What is your living situation (alone, family, other)?  If you are physically unable to care for yourself, who would you call on to help you?  Lives w adult disabled son, but "I have plenty of friends and my daughter lives just down the road, I have lots of people I can call on to help me if I need it." - Support system:  What does your support system look like?  Who would you call on if you needed some kind of practical help?  What if you needed someone to talk to for emotional support?  See above - Finances:  Are you concerned about finances.  Considering returning to work?  If not, applying for disability?  No concerns  What is your understanding of where you are with your cancer? Its cause?  Your treatment plan and what happens next? Will start radiation therapy for carcinoma of parotid gland.  "Now that all the bumps have smoothed out, I am just ready to get started."  Does not anticipate any concerning issues, if needs arise she is confident of her support system and its ability to assist as needed.    CSW Summary:  Patient and family psychosocial functioning including strengths, limitations, and coping skills:  79 year old female, newly diagnosed w parotid carinoma, will start radiation therapy next week.  Good support system, positive attitude, no concerns expressed about any practical or emotional needs/barriers.    Identifications of barriers to care:  None noted  Availability of community resources:  [place] resources and encouraged her to contact us as  needed.  Clinical Social Worker follow up needed: No. Edwyna Shell, [place] Social Worker Phone:  [phone]

## 2019-04-28 NOTE — Progress Notes (Signed)
Nutrition Assessment:  79 year old female with carcinoma of left parotid gland.  S/p left total parotidectomy on 1/26. Past medical history of HTN, bilateral breast cancer 2005.  Planning to start radiation on 3/10.    Spoke with patient via phone for nutrition assessment.  Patient reports that she currently has a good appetite.  She prepares meals at home and cares for her adult son with down's syndrome.  Patient reports that she cooks nutritious meals.  Concerned about fatigue, mouth sores and dysphagia with upcoming radiation treatment.  Reports remembers fatigue with radiation treatment for breast cancer.   Medications: Vit C, b complex, Vit D, fish oil, lactobaccilus, Mag ox  Labs: reviewed  Anthropometrics:   Height: 66 inches Weight: 243 lb 6.2 on 1/26 Stable weight per patient BMI: 39   Estimated Energy Needs  Kcals: 2420-2750 Protein: 88-118 g Fluid: > 2.4 L  NUTRITION DIAGNOSIS: Predicted suboptimal energy intake related to parotid gland cancer as evidenced by side effects from planned treatment effecting nutrition   INTERVENTION:  Discussed importance of good nutrition during treatment. Encouraged well-balanced diet focusing on good sources of protein.   Discussed soft, moist foods and foods to have on hand to help with fatigue.  Patient reports has good support system to help with meals if needed.  Patient has contact informtion    MONITORING, EVALUATION, GOAL: Patient will consume adequate calories and protein to maintain muscle mass during treatment   NEXT VISIT: March 17th via phone  Mackenzee Becvar B. Zenia Resides, [place], [place] Registered Dietitian [phone] (pager)

## 2019-04-30 DIAGNOSIS — Z510 Encounter for antineoplastic radiation therapy: Secondary | ICD-10-CM | POA: Diagnosis not present

## 2019-05-05 ENCOUNTER — Other Ambulatory Visit: Payer: Self-pay

## 2019-05-05 ENCOUNTER — Ambulatory Visit
Admission: RE | Admit: 2019-05-05 | Discharge: 2019-05-05 | Disposition: A | Discharge status: Home / Self Care | Payer: Medicare Other | Source: Ambulatory Visit | Attending: Radiation Oncology | Admitting: Radiation Oncology

## 2019-05-05 ENCOUNTER — Encounter: Payer: Self-pay | Admitting: *Deleted

## 2019-05-05 DIAGNOSIS — C07 Malignant neoplasm of parotid gland: Secondary | ICD-10-CM

## 2019-05-05 DIAGNOSIS — Z510 Encounter for antineoplastic radiation therapy: Secondary | ICD-10-CM | POA: Diagnosis not present

## 2019-05-05 MED ORDER — SONAFINE EX EMUL
1.0000 "application " | Freq: Once | CUTANEOUS | Status: AC
  Administered 2019-05-05: 1 via TOPICAL

## 2019-05-05 NOTE — Progress Notes (Signed)
Oncology Nurse Navigator Documentation  To provide support, encouragement and care continuity, met with Ms. Liston for her initial RT.    I reviewed the 2-step treatment process, answered questions.   She completed treatment without difficulty, denied questions/concerns.  I reviewed the registration/arrival procedure for subsequent treatments.  I escorted her to [place] for post-SIM ed with RN Anderson Malta.  I encouraged her to call me with questions/concerns as tmts proceed.  Gayleen Orem, RN, BSN Head & Neck Oncology Nurse [place] at [place] [phone]

## 2019-05-05 NOTE — Progress Notes (Signed)

## 2019-05-06 ENCOUNTER — Other Ambulatory Visit: Payer: Self-pay

## 2019-05-06 ENCOUNTER — Ambulatory Visit
Admission: RE | Admit: 2019-05-06 | Discharge: 2019-05-06 | Disposition: A | Discharge status: Home / Self Care | Payer: Medicare Other | Source: Ambulatory Visit | Attending: Radiation Oncology | Admitting: Radiation Oncology

## 2019-05-06 DIAGNOSIS — Z510 Encounter for antineoplastic radiation therapy: Secondary | ICD-10-CM | POA: Diagnosis not present

## 2019-05-07 ENCOUNTER — Ambulatory Visit
Admission: RE | Admit: 2019-05-07 | Discharge: 2019-05-07 | Disposition: A | Discharge status: Home / Self Care | Payer: Medicare Other | Source: Ambulatory Visit | Attending: Radiation Oncology | Admitting: Radiation Oncology

## 2019-05-07 ENCOUNTER — Other Ambulatory Visit: Payer: Self-pay

## 2019-05-07 DIAGNOSIS — Z510 Encounter for antineoplastic radiation therapy: Secondary | ICD-10-CM | POA: Diagnosis not present

## 2019-05-10 ENCOUNTER — Ambulatory Visit
Admission: RE | Admit: 2019-05-10 | Discharge: 2019-05-10 | Disposition: A | Discharge status: Home / Self Care | Payer: Medicare Other | Source: Ambulatory Visit | Attending: Radiation Oncology | Admitting: Radiation Oncology

## 2019-05-10 ENCOUNTER — Other Ambulatory Visit: Payer: Self-pay

## 2019-05-10 DIAGNOSIS — Z510 Encounter for antineoplastic radiation therapy: Secondary | ICD-10-CM | POA: Diagnosis not present

## 2019-05-11 ENCOUNTER — Other Ambulatory Visit: Payer: Self-pay

## 2019-05-11 ENCOUNTER — Ambulatory Visit
Admission: RE | Admit: 2019-05-11 | Discharge: 2019-05-11 | Disposition: A | Discharge status: Home / Self Care | Payer: Medicare Other | Source: Ambulatory Visit | Attending: Radiation Oncology | Admitting: Radiation Oncology

## 2019-05-11 DIAGNOSIS — Z510 Encounter for antineoplastic radiation therapy: Secondary | ICD-10-CM | POA: Diagnosis not present

## 2019-05-12 ENCOUNTER — Ambulatory Visit
Admission: RE | Admit: 2019-05-12 | Discharge: 2019-05-12 | Disposition: A | Discharge status: Home / Self Care | Payer: Medicare Other | Source: Ambulatory Visit | Attending: Radiation Oncology | Admitting: Radiation Oncology

## 2019-05-12 ENCOUNTER — Other Ambulatory Visit: Payer: Self-pay

## 2019-05-12 ENCOUNTER — Inpatient Hospital Stay: Payer: Medicare Other

## 2019-05-12 DIAGNOSIS — Z510 Encounter for antineoplastic radiation therapy: Secondary | ICD-10-CM | POA: Diagnosis not present

## 2019-05-12 NOTE — Progress Notes (Signed)
Nutrition Follow-up:  Patient with carcinoma of left parotid gland.  S/p left total parotidectomy on 1/26.  Patient has started radiation.    Spoke with patient via phone for nutrition follow-up.  Patient reports that she is eating.  Reports chronic dry mouth but she has changed toothpaste and using other strategies to help.  Reports some taste changes and thicker saliva but otherwise eating ok.      Medications: reviewed  Labs: reviewed  Anthropometrics:   Weight per [place] on 3/15 248 lb 8 oz increased from 243 lb on 1/26.    NUTRITION DIAGNOSIS: Predicted suboptimal energy intake continues with treatment   INTERVENTION:  Encouraged patient to continue with good nutrition focusing on protein foods.   Patient has contact information     MONITORING, EVALUATION, GOAL: Patient will consume adequate calories and protein to maintain muscle mass   NEXT VISIT: March 31, Wednesday, phone  Alford Gamero B. Zenia Resides, [place], [place] Registered Dietitian [phone] (pager)

## 2019-05-13 ENCOUNTER — Ambulatory Visit
Admission: RE | Admit: 2019-05-13 | Discharge: 2019-05-13 | Disposition: A | Discharge status: Home / Self Care | Payer: Medicare Other | Source: Ambulatory Visit | Attending: Radiation Oncology | Admitting: Radiation Oncology

## 2019-05-13 ENCOUNTER — Other Ambulatory Visit: Payer: Self-pay

## 2019-05-13 DIAGNOSIS — Z510 Encounter for antineoplastic radiation therapy: Secondary | ICD-10-CM | POA: Diagnosis not present

## 2019-05-14 ENCOUNTER — Ambulatory Visit
Admission: RE | Admit: 2019-05-14 | Discharge: 2019-05-14 | Disposition: A | Discharge status: Home / Self Care | Payer: Medicare Other | Source: Ambulatory Visit | Attending: Radiation Oncology | Admitting: Radiation Oncology

## 2019-05-14 ENCOUNTER — Other Ambulatory Visit: Payer: Self-pay

## 2019-05-14 DIAGNOSIS — Z510 Encounter for antineoplastic radiation therapy: Secondary | ICD-10-CM | POA: Diagnosis not present

## 2019-05-17 ENCOUNTER — Ambulatory Visit
Admission: RE | Admit: 2019-05-17 | Discharge: 2019-05-17 | Disposition: A | Discharge status: Home / Self Care | Payer: Medicare Other | Source: Ambulatory Visit | Attending: Radiation Oncology | Admitting: Radiation Oncology

## 2019-05-17 ENCOUNTER — Other Ambulatory Visit: Payer: Self-pay

## 2019-05-17 DIAGNOSIS — Z510 Encounter for antineoplastic radiation therapy: Secondary | ICD-10-CM | POA: Diagnosis not present

## 2019-05-18 ENCOUNTER — Other Ambulatory Visit: Payer: Self-pay

## 2019-05-18 ENCOUNTER — Ambulatory Visit
Admission: RE | Admit: 2019-05-18 | Discharge: 2019-05-18 | Disposition: A | Discharge status: Home / Self Care | Payer: Medicare Other | Source: Ambulatory Visit | Attending: Radiation Oncology | Admitting: Radiation Oncology

## 2019-05-18 DIAGNOSIS — Z510 Encounter for antineoplastic radiation therapy: Secondary | ICD-10-CM | POA: Diagnosis not present

## 2019-05-19 ENCOUNTER — Other Ambulatory Visit: Payer: Self-pay

## 2019-05-19 ENCOUNTER — Ambulatory Visit
Admission: RE | Admit: 2019-05-19 | Discharge: 2019-05-19 | Disposition: A | Discharge status: Home / Self Care | Payer: Medicare Other | Source: Ambulatory Visit | Attending: Radiation Oncology | Admitting: Radiation Oncology

## 2019-05-19 DIAGNOSIS — Z510 Encounter for antineoplastic radiation therapy: Secondary | ICD-10-CM | POA: Diagnosis not present

## 2019-05-20 ENCOUNTER — Ambulatory Visit
Admission: RE | Admit: 2019-05-20 | Discharge: 2019-05-20 | Disposition: A | Discharge status: Home / Self Care | Payer: Medicare Other | Source: Ambulatory Visit | Attending: Radiation Oncology | Admitting: Radiation Oncology

## 2019-05-20 ENCOUNTER — Other Ambulatory Visit: Payer: Self-pay

## 2019-05-20 DIAGNOSIS — Z510 Encounter for antineoplastic radiation therapy: Secondary | ICD-10-CM | POA: Diagnosis not present

## 2019-05-21 ENCOUNTER — Ambulatory Visit
Admission: RE | Admit: 2019-05-21 | Discharge: 2019-05-21 | Disposition: A | Discharge status: Home / Self Care | Payer: Medicare Other | Source: Ambulatory Visit | Attending: Radiation Oncology | Admitting: Radiation Oncology

## 2019-05-21 ENCOUNTER — Other Ambulatory Visit: Payer: Self-pay

## 2019-05-21 DIAGNOSIS — Z510 Encounter for antineoplastic radiation therapy: Secondary | ICD-10-CM | POA: Diagnosis not present

## 2019-05-24 ENCOUNTER — Other Ambulatory Visit: Payer: Self-pay

## 2019-05-24 ENCOUNTER — Ambulatory Visit
Admission: RE | Admit: 2019-05-24 | Discharge: 2019-05-24 | Disposition: A | Discharge status: Home / Self Care | Payer: Medicare Other | Source: Ambulatory Visit | Attending: Radiation Oncology | Admitting: Radiation Oncology

## 2019-05-24 DIAGNOSIS — Z510 Encounter for antineoplastic radiation therapy: Secondary | ICD-10-CM | POA: Diagnosis not present

## 2019-05-25 ENCOUNTER — Ambulatory Visit
Admission: RE | Admit: 2019-05-25 | Discharge: 2019-05-25 | Disposition: A | Discharge status: Home / Self Care | Payer: Medicare Other | Source: Ambulatory Visit | Attending: Radiation Oncology | Admitting: Radiation Oncology

## 2019-05-25 ENCOUNTER — Other Ambulatory Visit: Payer: Self-pay

## 2019-05-25 DIAGNOSIS — Z510 Encounter for antineoplastic radiation therapy: Secondary | ICD-10-CM | POA: Diagnosis not present

## 2019-05-26 ENCOUNTER — Ambulatory Visit
Admission: RE | Admit: 2019-05-26 | Discharge: 2019-05-26 | Disposition: A | Discharge status: Home / Self Care | Payer: Medicare Other | Source: Ambulatory Visit | Attending: Radiation Oncology | Admitting: Radiation Oncology

## 2019-05-26 ENCOUNTER — Inpatient Hospital Stay: Payer: Medicare Other

## 2019-05-26 DIAGNOSIS — Z510 Encounter for antineoplastic radiation therapy: Secondary | ICD-10-CM | POA: Diagnosis not present

## 2019-05-26 NOTE — Progress Notes (Signed)
Nutrition Follow-up:  Patient with carcinoma of left parotid gland.  S/p left parotidectomy on 1/26.  Patient had started radiation.   Spoke with patient via phone.  Patient reports metallic taste in her mouth and taste changes.  Reports that breakfast type foods are tasting good to her (eggs, grits, oatmeal).  She has been trying to get in enough foods (drinking liquids, popsciles).    Medications: reviewed  Labs: no new  Anthropometrics:   Weight 244 lb on 3/29 decreased from 248 lb 8 oz on 3/15 243 lb on 1/26   NUTRITION DIAGNOSIS: Predicted suboptimal energy intake continues with treatment.    INTERVENTION:  Encouraged patient to use plastic utensils.  Discussed other strategies to help with taste changes. Patient with questions regarding oral nutrition supplements.  Will provide sample of shakes and take to radiation for her to pick up tomorrow at treatment.  Patient has contact information    MONITORING, EVALUATION, GOAL: Patient will consume adequate calories and protein to maintain muscle mass.    NEXT VISIT: Wednesday, April 14th phone f/u  Trina Asch B. Zenia Resides, [place], [place] Registered Dietitian [phone] (pager)

## 2019-05-27 ENCOUNTER — Other Ambulatory Visit: Payer: Self-pay

## 2019-05-27 ENCOUNTER — Ambulatory Visit
Admission: RE | Admit: 2019-05-27 | Discharge: 2019-05-27 | Disposition: A | Discharge status: Home / Self Care | Payer: Medicare Other | Source: Ambulatory Visit | Attending: Radiation Oncology | Admitting: Radiation Oncology

## 2019-05-27 DIAGNOSIS — Z510 Encounter for antineoplastic radiation therapy: Secondary | ICD-10-CM | POA: Diagnosis not present

## 2019-05-27 DIAGNOSIS — C07 Malignant neoplasm of parotid gland: Secondary | ICD-10-CM | POA: Insufficient documentation

## 2019-05-28 ENCOUNTER — Ambulatory Visit
Admission: RE | Admit: 2019-05-28 | Discharge: 2019-05-28 | Disposition: A | Discharge status: Home / Self Care | Payer: Medicare Other | Source: Ambulatory Visit | Attending: Radiation Oncology | Admitting: Radiation Oncology

## 2019-05-28 ENCOUNTER — Other Ambulatory Visit: Payer: Self-pay

## 2019-05-28 DIAGNOSIS — Z510 Encounter for antineoplastic radiation therapy: Secondary | ICD-10-CM | POA: Diagnosis not present

## 2019-05-31 ENCOUNTER — Ambulatory Visit
Admission: RE | Admit: 2019-05-31 | Discharge: 2019-05-31 | Disposition: A | Discharge status: Home / Self Care | Payer: Medicare Other | Source: Ambulatory Visit | Attending: Radiation Oncology | Admitting: Radiation Oncology

## 2019-05-31 ENCOUNTER — Other Ambulatory Visit: Payer: Self-pay

## 2019-05-31 DIAGNOSIS — Z510 Encounter for antineoplastic radiation therapy: Secondary | ICD-10-CM | POA: Diagnosis not present

## 2019-06-01 ENCOUNTER — Ambulatory Visit
Admission: RE | Admit: 2019-06-01 | Discharge: 2019-06-01 | Disposition: A | Discharge status: Home / Self Care | Payer: Medicare Other | Source: Ambulatory Visit | Attending: Radiation Oncology | Admitting: Radiation Oncology

## 2019-06-01 ENCOUNTER — Other Ambulatory Visit: Payer: Self-pay

## 2019-06-01 DIAGNOSIS — Z510 Encounter for antineoplastic radiation therapy: Secondary | ICD-10-CM | POA: Diagnosis not present

## 2019-06-02 ENCOUNTER — Ambulatory Visit
Admission: RE | Admit: 2019-06-02 | Discharge: 2019-06-02 | Disposition: A | Discharge status: Home / Self Care | Payer: Medicare Other | Source: Ambulatory Visit | Attending: Radiation Oncology | Admitting: Radiation Oncology

## 2019-06-02 ENCOUNTER — Other Ambulatory Visit: Payer: Self-pay

## 2019-06-02 DIAGNOSIS — Z510 Encounter for antineoplastic radiation therapy: Secondary | ICD-10-CM | POA: Diagnosis not present

## 2019-06-03 ENCOUNTER — Other Ambulatory Visit: Payer: Self-pay

## 2019-06-03 ENCOUNTER — Ambulatory Visit
Admission: RE | Admit: 2019-06-03 | Discharge: 2019-06-03 | Disposition: A | Discharge status: Home / Self Care | Payer: Medicare Other | Source: Ambulatory Visit | Attending: Radiation Oncology | Admitting: Radiation Oncology

## 2019-06-03 DIAGNOSIS — Z510 Encounter for antineoplastic radiation therapy: Secondary | ICD-10-CM | POA: Diagnosis not present

## 2019-06-04 ENCOUNTER — Other Ambulatory Visit: Payer: Self-pay

## 2019-06-04 ENCOUNTER — Ambulatory Visit
Admission: RE | Admit: 2019-06-04 | Discharge: 2019-06-04 | Disposition: A | Discharge status: Home / Self Care | Payer: Medicare Other | Source: Ambulatory Visit | Attending: Radiation Oncology | Admitting: Radiation Oncology

## 2019-06-04 DIAGNOSIS — Z510 Encounter for antineoplastic radiation therapy: Secondary | ICD-10-CM | POA: Diagnosis not present

## 2019-06-07 ENCOUNTER — Ambulatory Visit
Admission: RE | Admit: 2019-06-07 | Discharge: 2019-06-07 | Disposition: A | Discharge status: Home / Self Care | Payer: Medicare Other | Source: Ambulatory Visit | Attending: Radiation Oncology | Admitting: Radiation Oncology

## 2019-06-07 ENCOUNTER — Other Ambulatory Visit: Payer: Self-pay

## 2019-06-07 DIAGNOSIS — Z510 Encounter for antineoplastic radiation therapy: Secondary | ICD-10-CM | POA: Diagnosis not present

## 2019-06-08 ENCOUNTER — Other Ambulatory Visit: Payer: Self-pay

## 2019-06-08 ENCOUNTER — Ambulatory Visit
Admission: RE | Admit: 2019-06-08 | Discharge: 2019-06-08 | Disposition: A | Discharge status: Home / Self Care | Payer: Medicare Other | Source: Ambulatory Visit | Attending: Radiation Oncology | Admitting: Radiation Oncology

## 2019-06-08 DIAGNOSIS — Z510 Encounter for antineoplastic radiation therapy: Secondary | ICD-10-CM | POA: Diagnosis not present

## 2019-06-09 ENCOUNTER — Ambulatory Visit
Admission: RE | Admit: 2019-06-09 | Discharge: 2019-06-09 | Disposition: A | Discharge status: Home / Self Care | Payer: Medicare Other | Source: Ambulatory Visit | Attending: Radiation Oncology | Admitting: Radiation Oncology

## 2019-06-09 ENCOUNTER — Inpatient Hospital Stay: Payer: Medicare Other | Attending: Radiation Oncology

## 2019-06-09 ENCOUNTER — Other Ambulatory Visit: Payer: Self-pay

## 2019-06-09 DIAGNOSIS — Z510 Encounter for antineoplastic radiation therapy: Secondary | ICD-10-CM | POA: Diagnosis not present

## 2019-06-09 NOTE — Progress Notes (Signed)
Nutrition Follow-up:  Patient with carcinoma of left parotid gland.  S/p left parotidectomy on 1/26.  Patient receiving radiation.  Last treatment 4/20.   Spoke with patient via phone.  Patient reports really enjoyed orgain shake and has been drinking 1 a day but sometimes none during the day if eating ok.  Reports has been having issues with nausea but feels it is related to not having anything on stomach because foods have an bad taste.  Magdalene Patricia has help decrease nausea and likes the flavor.  Reports ensure caused diarrhea.  Has been eating meat at least 1 time per day.  Has been including other protein foods eggs, cheese, yogurt, pudding to increase protein in diet.  "I am doing well on eating."    Medications: reviewed  Labs: no new  Anthropometrics:   Weight 240 lb per aria on 4/12 decreased from 244 lb on 3/29 248 lb on 3/15 243 lb on 1/26.     NUTRITION DIAGNOSIS: Predicted suboptimal energy intake continues with treatment    INTERVENTION:  Patient to continue orgain shake for added nutrition. Encouraged good sources of protein Patient has contact information and will contact RD if needed     NEXT VISIT: no follow-up planned.  Patient to contact RD if needed in the future  Shelie Lansing B. Zenia Resides, [place], [place] Registered Dietitian [phone] (pager)

## 2019-06-10 ENCOUNTER — Other Ambulatory Visit: Payer: Self-pay

## 2019-06-10 ENCOUNTER — Ambulatory Visit
Admission: RE | Admit: 2019-06-10 | Discharge: 2019-06-10 | Disposition: A | Discharge status: Home / Self Care | Payer: Medicare Other | Source: Ambulatory Visit | Attending: Radiation Oncology | Admitting: Radiation Oncology

## 2019-06-10 DIAGNOSIS — Z510 Encounter for antineoplastic radiation therapy: Secondary | ICD-10-CM | POA: Diagnosis not present

## 2019-06-11 ENCOUNTER — Other Ambulatory Visit: Payer: Self-pay

## 2019-06-11 ENCOUNTER — Ambulatory Visit
Admission: RE | Admit: 2019-06-11 | Discharge: 2019-06-11 | Disposition: A | Discharge status: Home / Self Care | Payer: Medicare Other | Source: Ambulatory Visit | Attending: Radiation Oncology | Admitting: Radiation Oncology

## 2019-06-11 DIAGNOSIS — Z510 Encounter for antineoplastic radiation therapy: Secondary | ICD-10-CM | POA: Diagnosis not present

## 2019-06-14 ENCOUNTER — Other Ambulatory Visit: Payer: Self-pay

## 2019-06-14 ENCOUNTER — Ambulatory Visit
Admission: RE | Admit: 2019-06-14 | Discharge: 2019-06-14 | Disposition: A | Discharge status: Home / Self Care | Payer: Medicare Other | Source: Ambulatory Visit | Attending: Radiation Oncology | Admitting: Radiation Oncology

## 2019-06-14 DIAGNOSIS — Z510 Encounter for antineoplastic radiation therapy: Secondary | ICD-10-CM | POA: Diagnosis not present

## 2019-06-15 ENCOUNTER — Ambulatory Visit
Admission: RE | Admit: 2019-06-15 | Discharge: 2019-06-15 | Disposition: A | Discharge status: Home / Self Care | Payer: Medicare Other | Source: Ambulatory Visit | Attending: Radiation Oncology | Admitting: Radiation Oncology

## 2019-06-15 ENCOUNTER — Encounter: Payer: Self-pay | Admitting: Radiation Oncology

## 2019-06-15 ENCOUNTER — Other Ambulatory Visit: Payer: Self-pay

## 2019-06-15 DIAGNOSIS — Z510 Encounter for antineoplastic radiation therapy: Secondary | ICD-10-CM | POA: Diagnosis not present

## 2019-06-15 NOTE — Progress Notes (Signed)
Oncology Nurse Navigator Documentation  Met with ... after final RT to offer support and to celebrate end of radiation treatment.   Provided verbal/written post-RT guidance:   Importance of protecting treatment area from sun.  Continuation of Sonafine application 2-3 times daily, application of antibiotic ointment to areas of raw skin; when supply of Sonafine exhausted transition to OTC lotion with vitamin E. Provided/reviewed Epic calendar of upcoming appts including follow up appointment with Dr. Isidore Moos on 07/02/19 Explained my role as navigator will continue for several more months, encouraged him to call me with needs/concerns.    Harlow Asa RN, BSN, OCN Head & Neck Oncology Nurse [place] at [facility] Phone # [phone]  Fax # [phone]

## 2019-06-28 NOTE — Progress Notes (Signed)
Jenna Jordan presents today for 2 week follow up after completing radiation to the left parotid on 06/15/2019  Pain issues, if any: None Using a feeding tube?: N/A Weight changes, if any:  Wt Readings from Last 3 Encounters:  07/02/19 231 lb 6 oz (105 kg)  03/23/19 243 lb 6.2 oz (110.4 kg)  01/13/18 239 lb (108.4 kg)   Swallowing issues, if any: None, but still doesn't have much taste but is trying to make herself eat regularly Smoking or chewing tobacco? None Using fluoride trays daily? Using fluoride toothpaste from Dr. Enrique Sack Last ENT visit was on: Not since parotidectomy by Dr. Leta Baptist on 03/24/2019; Is scheduled for F/U with Dr. Benjamine Mola in June of this year Other notable issues, if any: Skin is healing well and she continues to use Sonafine cream. Denies any ear of jaw pain  Vitals:   07/02/19 0955  BP: 118/69  Pulse: 67  Resp: 18  Temp: 98.5 F (36.9 C)  TempSrc: Temporal  SpO2: 99%  Weight: 231 lb 6 oz (105 kg)  Height: 5\' 6"  (1.676 m)

## 2019-07-02 ENCOUNTER — Other Ambulatory Visit: Payer: Self-pay

## 2019-07-02 ENCOUNTER — Encounter: Payer: Self-pay | Admitting: Radiation Oncology

## 2019-07-02 ENCOUNTER — Ambulatory Visit
Admission: RE | Admit: 2019-07-02 | Discharge: 2019-07-02 | Disposition: A | Discharge status: Home / Self Care | Payer: Medicare Other | Source: Ambulatory Visit | Attending: Radiation Oncology | Admitting: Radiation Oncology

## 2019-07-02 VITALS — BP 118/69 | HR 67 | Temp 98.5°F | Resp 18 | Ht 66.0 in | Wt 231.4 lb

## 2019-07-02 DIAGNOSIS — Z79899 Other long term (current) drug therapy: Secondary | ICD-10-CM | POA: Insufficient documentation

## 2019-07-02 DIAGNOSIS — R682 Dry mouth, unspecified: Secondary | ICD-10-CM | POA: Insufficient documentation

## 2019-07-02 DIAGNOSIS — C07 Malignant neoplasm of parotid gland: Secondary | ICD-10-CM | POA: Insufficient documentation

## 2019-07-02 DIAGNOSIS — Z7982 Long term (current) use of aspirin: Secondary | ICD-10-CM | POA: Diagnosis not present

## 2019-07-02 DIAGNOSIS — Z923 Personal history of irradiation: Secondary | ICD-10-CM | POA: Insufficient documentation

## 2019-07-02 NOTE — Progress Notes (Signed)
Radiation Oncology         [phone] ________________________________  Name: Jenna Jordan MRN: [id number]  Date: 07/02/2019  DOB: 1941-01-29  Follow-Up Visit Note  Outpatient  CC: Willey Blade, MD  Leta Baptist, MD  Diagnosis and Prior Radiotherapy:    ICD-10-CM   1. Carcinoma of LEFT parotid gland ([place])  C07 CT Soft Tissue Neck W Contrast    BUN & Creatinine ([facility])    CHIEF COMPLAINT: Here for follow-up and surveillance of parotid cancer  Narrative:  The patient returns today for routine follow-up.  She is doing well.  Her taste is returning.  However, food is still not that enjoyable.  Skin is healing well.  She has a small scab at the lower ear that is resolving.     She has a dry mouth and has Biotene products.                         ALLERGIES:  is allergic to ibuprofen; oxycodone-acetaminophen; statins; codeine; demerol; morphine and related; promethazine hcl; septra [bactrim]; and tramadol.  Meds: Current Outpatient Medications  Medication Sig Dispense Refill  . acetaminophen (TYLENOL) 500 MG tablet Take 500 mg by mouth every 6 (six) hours as needed.      . Ascorbic Acid (VITAMIN C) 1000 MG tablet Take 1,000 mg by mouth daily.      Marland Kitchen aspirin 81 MG EC tablet Take 162 mg by mouth.     Marland Kitchen b complex vitamins tablet Take 1 tablet by mouth daily.      . Calcium Carb-Cholecalciferol (CALCIUM 1000 + D PO) Take 1 capsule by mouth daily.      . Cholecalciferol (VITAMIN D3) 1000 UNITS CAPS Take 1 capsule by mouth daily.      . fish oil-omega-3 fatty acids 1000 MG capsule Take 2 g by mouth daily.      Marland Kitchen lactobacillus acidophilus (BACID) TABS tablet Take 2 tablets by mouth daily.     . magnesium oxide (MAG-OX) 400 MG tablet Take 400 mg by mouth daily.    . meloxicam (MOBIC) 15 MG tablet Take 15 mg by mouth daily.    . sodium fluoride (PREVIDENT 5000 PLUS) 1.1 % CREA dental cream Apply to tooth brush. Brush teeth for 2 minutes. Spit out excess-DO NOT swallow. Repeat nightly. 1 Tube  prn  . triamterene-hydrochlorothiazide (MAXZIDE-25) 37.5-25 MG tablet Take 1 tablet by mouth daily.     No current facility-administered medications for this encounter.    Physical Findings: The patient is in no acute distress. Patient is alert and oriented.  height is 5\' 6"  (1.676 m) and weight is 231 lb 6 oz (105 kg). Her temporal temperature is 98.5 F (36.9 C). Her blood pressure is 118/69 and her pulse is 67. Her respiration is 18 and oxygen saturation is 99%. .    Skin is healed beautifully.  She still has a scab at the lower left external ear which is not oozing.  Oral cavity is clear and relatively dry.  Lab Findings: Lab Results  Component Value Date   WBC 9.1 01/06/2018   HGB 13.5 01/06/2018   HCT 41.7 01/06/2018   MCV 95.6 01/06/2018   PLT 335 01/06/2018    Radiographic Findings: No results found.  Impression/Plan: She is healing well from radiotherapy. She will continue to push her nutrition as tolerated for recovery.  We talked about management of dry mouth including sips of water and avoiding sugary drinks.  She will use Biotene as needed.  I will see her back in 2 and half months with restaging scans.  She is pleased with this plan.  We discussed measures to reduce the risk of infection during the COVID-19 pandemic.  I strongly recommended that she pursue the COVID-19 vaccine.  She does not wish to do that quite yet, but she knows how to schedule it.  I encouraged her to call us if she needs any help with scheduling.  On date of service, in total, I spent 25 minutes on this encounter.  Patient was seen face-to-face.    _____________________________________   Eppie Gibson, MD

## 2019-07-02 NOTE — Progress Notes (Signed)
Oncology Nurse Navigator Documentation  I met with patient during her 2 week follow up appointment with Dr. Isidore Moos. She is doing well, eating softer foods and nutritional supplements. She continues to have taste changes, but is trying different foods daily. I assisted her in making a 2 1/2 month follow up with Dr. Isidore Moos which will take place after follow up imaging after radiation treatment.   Harlow Asa RN, BSN, OCN Head & Neck Oncology Nurse [place] at [facility] Phone # [phone]  Fax # [phone]

## 2019-07-26 NOTE — Progress Notes (Signed)
  Patient Name: Jenna Jordan MRN: [id number] DOB: 1940/07/17 Referring Physician: Benjamine Mola SUI (Profile Not Attached) Date of Service: 06/15/2019 [facility], [place]                                                        End Of Treatment Note  Diagnoses: C07-Malignant neoplasm of parotid gland  Intent: Curative  Radiation Treatment Dates: 05/05/2019 through 06/15/2019  Site Technique Total Dose (Gy) Dose per Fx (Gy) Completed Fx Beam Energies  Parotid, Left: HN_Lt_Parotid IMRT 60/60 2 30/30 6X   Narrative: The patient tolerated radiation therapy relatively well.   Plan: The patient will follow-up with radiation oncology within the next month.  -----------------------------------  Eppie Gibson, MD

## 2019-09-14 ENCOUNTER — Telehealth: Payer: Self-pay | Admitting: *Deleted

## 2019-09-14 NOTE — Telephone Encounter (Signed)
CALLED PATIENT TO INFORM OF STAT LABS ON 09-23-19 @ 9:45 AM @ [place] AND HER CT ON 09-23-19 - ARRIVAL TIME- 10:45 AM @ [facility], PATIENT TO HAVE WATER ONLY- 4 HRS. PRIOR TO TEST, PATIENT TO RECEIVE RESULTS FROM DR. SQUIRE ON 09-24-19 @ 11 AM, SPOKE WITH PATIENT AND SHE VERIFIED UNDERSTANDING ALL THESE APPTS.

## 2019-09-16 ENCOUNTER — Telehealth: Payer: Self-pay | Admitting: *Deleted

## 2019-09-16 NOTE — Telephone Encounter (Signed)
CALLED PATIENT TO ASK ABOUT WHEN SHE WOULD LIKE TO SEE DR. SQUIRE - PATIENT AGREED TO 10 AM ON 09-24-19

## 2019-09-23 ENCOUNTER — Other Ambulatory Visit: Payer: Self-pay

## 2019-09-23 ENCOUNTER — Encounter (HOSPITAL_COMMUNITY): Payer: Self-pay

## 2019-09-23 ENCOUNTER — Ambulatory Visit
Admission: RE | Admit: 2019-09-23 | Discharge: 2019-09-23 | Disposition: A | Discharge status: Home / Self Care | Payer: Medicare Other | Source: Ambulatory Visit | Attending: Radiation Oncology | Admitting: Radiation Oncology

## 2019-09-23 ENCOUNTER — Ambulatory Visit (HOSPITAL_COMMUNITY)
Admission: RE | Admit: 2019-09-23 | Discharge: 2019-09-23 | Disposition: A | Discharge status: Home / Self Care | Payer: Medicare Other | Source: Ambulatory Visit | Attending: Radiation Oncology | Admitting: Radiation Oncology

## 2019-09-23 DIAGNOSIS — Z8589 Personal history of malignant neoplasm of other organs and systems: Secondary | ICD-10-CM | POA: Diagnosis not present

## 2019-09-23 DIAGNOSIS — C07 Malignant neoplasm of parotid gland: Secondary | ICD-10-CM

## 2019-09-23 LAB — BUN & CREATININE (CHCC)
BUN: 20 mg/dL (ref 8–23)
Creatinine: 0.96 mg/dL (ref 0.44–1.00)
GFR, Est AFR Am: >60 mL/min (ref >60)
GFR, Estimated: 57 mL/min — ABNORMAL LOW (ref >60)

## 2019-09-23 MED ORDER — SODIUM CHLORIDE (PF) 0.9 % IJ SOLN
INTRAMUSCULAR | Status: AC
  Filled 2019-09-23: qty 50

## 2019-09-23 MED ORDER — IOHEXOL 300 MG/ML  SOLN
75.0000 mL | Freq: Once | INTRAMUSCULAR | Status: AC | PRN
  Administered 2019-09-23: 75 mL via INTRAVENOUS

## 2019-09-23 NOTE — Progress Notes (Signed)
Jenna Jordan presents today for follow up after completing radiation to the left parotid on 06/15/2019 and to review CT scan from 09/23/2019  Pain issues, if any: Patient denies Using a feeding tube?: N/A Weight changes, if any:  Wt Readings from Last 3 Encounters:  09/24/19 (!) 223 lb (101.2 kg)  07/02/19 231 lb 6 oz (105 kg)  03/23/19 243 lb 6.2 oz (110.4 kg)   Swallowing issues, if any: Patient denies. States she is able to eat and drink without difficulty, unless something is very dry (like bread, cookies, etc) Smoking or chewing tobacco? None Using fluoride trays daily? Using fluoride toothpaste Last ENT visit was on: Saw Dr. Leta Baptist in June  Other notable issues, if any: Patient continues to deal with dry mouth and tenderness to her left jaw. Denies any lymphedema.   Vitals:   09/24/19 1013  BP: (!) 112/62  Pulse: 66  Resp: 20  Temp: 98.4 F (36.9 C)  SpO2: 99%

## 2019-09-24 ENCOUNTER — Encounter: Payer: Self-pay | Admitting: Radiation Oncology

## 2019-09-24 ENCOUNTER — Ambulatory Visit: Payer: Self-pay | Admitting: Radiation Oncology

## 2019-09-24 ENCOUNTER — Other Ambulatory Visit: Payer: Self-pay

## 2019-09-24 ENCOUNTER — Ambulatory Visit
Admission: RE | Admit: 2019-09-24 | Discharge: 2019-09-24 | Disposition: A | Discharge status: Home / Self Care | Payer: Medicare Other | Source: Ambulatory Visit | Attending: Radiation Oncology | Admitting: Radiation Oncology

## 2019-09-24 VITALS — BP 112/62 | HR 66 | Temp 98.4°F | Resp 20 | Ht 66.0 in | Wt 223.0 lb

## 2019-09-24 DIAGNOSIS — Z7982 Long term (current) use of aspirin: Secondary | ICD-10-CM | POA: Diagnosis not present

## 2019-09-24 DIAGNOSIS — Z923 Personal history of irradiation: Secondary | ICD-10-CM | POA: Diagnosis not present

## 2019-09-24 DIAGNOSIS — Z8589 Personal history of malignant neoplasm of other organs and systems: Secondary | ICD-10-CM | POA: Insufficient documentation

## 2019-09-24 DIAGNOSIS — Z79899 Other long term (current) drug therapy: Secondary | ICD-10-CM | POA: Diagnosis not present

## 2019-09-24 DIAGNOSIS — C07 Malignant neoplasm of parotid gland: Secondary | ICD-10-CM

## 2019-09-24 NOTE — Progress Notes (Signed)
Radiation Oncology         [phone] ________________________________  Name: Jenna Jordan MRN: [id number]  Date: 09/24/2019  DOB: 1941/02/14  Follow-Up Visit Note  Outpatient  CC: Willey Blade, MD  Leta Baptist, MD  Diagnosis and Prior Radiotherapy:    ICD-10-CM   1. Carcinoma of LEFT parotid gland ([place])  C07     CHIEF COMPLAINT: Here for follow-up and surveillance of parotid cancer  Narrative:  The patient returns today for routine follow-up.  She is doing well after completing radiation to the left parotid on 06/15/2019 and is here to review CT scan from 09/23/2019  Pain issues, if any: Patient denies Using a feeding tube?: N/A Weight changes, if any:  Wt Readings from Last 3 Encounters:  09/24/19 (!) 223 lb (101.2 kg)  07/02/19 231 lb 6 oz (105 kg)  03/23/19 243 lb 6.2 oz (110.4 kg)   Swallowing issues, if any: Patient denies. States she is able to eat and drink without difficulty, unless something is very dry (like bread, cookies, etc) Smoking or chewing tobacco? None Using fluoride trays daily? Using fluoride toothpaste Last ENT visit was on: Saw Dr. Leta Baptist in June  Other notable issues, if any: Patient continues to deal with dry mouth and tenderness to her left jaw. Denies any lymphedema.                           ALLERGIES:  is allergic to ibuprofen, oxycodone-acetaminophen, statins, codeine, demerol, morphine and related, promethazine hcl, septra [bactrim], and tramadol.  Meds: Current Outpatient Medications  Medication Sig Dispense Refill  . acetaminophen (TYLENOL) 500 MG tablet Take 500 mg by mouth every 6 (six) hours as needed.      . Ascorbic Acid (VITAMIN C) 1000 MG tablet Take 1,000 mg by mouth daily.      Marland Kitchen aspirin 81 MG EC tablet Take 162 mg by mouth.     Marland Kitchen b complex vitamins tablet Take 1 tablet by mouth daily.      . Calcium Carb-Cholecalciferol (CALCIUM 1000 + D PO) Take 1 capsule by mouth daily.      . Cholecalciferol (VITAMIN D3) 1000 UNITS  CAPS Take 1 capsule by mouth daily.      . fish oil-omega-3 fatty acids 1000 MG capsule Take 2 g by mouth daily.      Marland Kitchen lactobacillus acidophilus (BACID) TABS tablet Take 2 tablets by mouth daily.     . magnesium oxide (MAG-OX) 400 MG tablet Take 400 mg by mouth daily.    . meloxicam (MOBIC) 15 MG tablet Take 15 mg by mouth daily.    . sodium fluoride (PREVIDENT 5000 PLUS) 1.1 % CREA dental cream Apply to tooth brush. Brush teeth for 2 minutes. Spit out excess-DO NOT swallow. Repeat nightly. 1 Tube prn  . triamterene-hydrochlorothiazide (MAXZIDE-25) 37.5-25 MG tablet Take 1 tablet by mouth daily.     No current facility-administered medications for this encounter.    Physical Findings: The patient is in no acute distress. Patient is alert and oriented.  height is 5\' 6"  (1.676 m) and weight is 223 lb (101.2 kg) (abnormal). Her temperature is 98.4 F (36.9 C). Her blood pressure is 112/62 (abnormal) and her pulse is 66. Her respiration is 20 and oxygen saturation is 99%. .    Skin is healed beautifully. Oral cavity/upper throat are clear.  No palpable adenopathy in the facial or head and neck region.  Lab Findings: Lab  Results  Component Value Date   WBC 9.1 01/06/2018   HGB 13.5 01/06/2018   HCT 41.7 01/06/2018   MCV 95.6 01/06/2018   PLT 335 01/06/2018    Radiographic Findings: CT Soft Tissue Neck W Contrast  Result Date: 09/23/2019 CLINICAL DATA:  79 year old female status post surgically resected left parotid carcinoma initially diagnosed in December. Status post radiation. Restaging. EXAM: CT NECK WITH CONTRAST TECHNIQUE: Multidetector CT imaging of the neck was performed using the standard protocol following the bolus administration of intravenous contrast. CONTRAST:  22mL OMNIPAQUE IOHEXOL 300 MG/ML  SOLN COMPARISON:  Pretreatment noncontrast face CT 02/05/2019. FINDINGS: Pharynx and larynx: Mild motion artifact at the level of the tongue base. Laryngeal and pharyngeal soft tissue  contours are within normal limits. Negative parapharyngeal spaces. Negative retropharyngeal space. Salivary glands: Negative sublingual space. Submandibular glands and right parotid gland are within normal limits. Interval partial left parotidectomy. Indistinct increased soft tissue density in the surgical bed on series 3, image 23, but no masslike enhancement or opacity. The left stylomastoid foramen appears to remain normal. The scant remaining left parotid parenchyma is within normal limits. Thyroid: Negative. Lymph nodes: Negative.  No lymphadenopathy. Vascular: Major vascular structures in the neck and at the skull base appear patent. Mild carotid and distal vertebral artery calcified atherosclerosis. Limited intracranial: Negative. Visualized orbits: Negative. Mastoids and visualized paranasal sinuses: Clear. Skeleton: TMJ degeneration. Intermittent cervical spine degeneration (right side C2-C3 posterior element ankylosis). No acute or suspicious osseous lesion identified. Upper chest: Calcified aortic atherosclerosis. Partially visible left axillary surgical clips. Negative upper lungs. No superior mediastinal lymphadenopathy. IMPRESSION: 1. New baseline post treatment exam with no adverse features identified status post left parotid resection/radiation. 2. Otherwise negative Neck CT. Aortic Atherosclerosis (ICD10-I70.0). Electronically Signed   By: Genevie Ann M.D.   On: 09/23/2019 20:42    Impression/Plan: No evidence of disease and doing very well symptomatically  I reviewed her imaging personally.  No evidence of disease.  We will also review this at tumor board.  I will see her back in 6 months.  She will continue to see otolaryngology as well.  If any further imaging is recommended for surveillance by the tumor board, we can add this on to precede her next appointment w/ me.  We discussed measures to reduce the risk of infection during the COVID-19 pandemic.  I am relieved that since we last talked,  she decided to get the [facility] vaccine and she completed her vaccination in June.  On date of service, in total, I spent 25 minutes on this encounter.  Patient was seen face-to-face.    _____________________________________   Eppie Gibson, MD

## 2019-09-29 ENCOUNTER — Other Ambulatory Visit: Payer: Self-pay

## 2019-09-29 DIAGNOSIS — C07 Malignant neoplasm of parotid gland: Secondary | ICD-10-CM

## 2020-03-20 ENCOUNTER — Telehealth: Payer: Self-pay | Admitting: *Deleted

## 2020-03-20 NOTE — Telephone Encounter (Signed)
CALLED PATIENT TO INFORM OF STAT LABS ON 03-30-20  @ 11:45 AM @ [place] CT TO FOLLOW @ [facility], PATIENT TO HAVE WATER ONLY - 4 HRS. PRIOR TO TEST, , PATIENT TO COME FOR FU WITH DR. Isidore Moos ON 03-31-20 @ 10 AM FOR RESULTS, SPOKE WITH PATIENT AND SHE IS AWARE OF THESE APPTS.

## 2020-03-24 ENCOUNTER — Ambulatory Visit: Payer: Self-pay | Admitting: Radiation Oncology

## 2020-03-30 ENCOUNTER — Other Ambulatory Visit: Payer: Self-pay

## 2020-03-30 ENCOUNTER — Ambulatory Visit (HOSPITAL_COMMUNITY)
Admission: RE | Admit: 2020-03-30 | Discharge: 2020-03-30 | Disposition: A | Discharge status: Home / Self Care | Payer: Medicare Other | Source: Ambulatory Visit | Attending: Radiation Oncology | Admitting: Radiation Oncology

## 2020-03-30 ENCOUNTER — Ambulatory Visit
Admission: RE | Admit: 2020-03-30 | Discharge: 2020-03-30 | Disposition: A | Discharge status: Home / Self Care | Payer: Medicare Other | Source: Ambulatory Visit | Attending: Radiation Oncology | Admitting: Radiation Oncology

## 2020-03-30 ENCOUNTER — Other Ambulatory Visit: Payer: Self-pay | Admitting: Radiation Oncology

## 2020-03-30 DIAGNOSIS — Z1329 Encounter for screening for other suspected endocrine disorder: Secondary | ICD-10-CM

## 2020-03-30 DIAGNOSIS — Z79899 Other long term (current) drug therapy: Secondary | ICD-10-CM | POA: Diagnosis not present

## 2020-03-30 DIAGNOSIS — C07 Malignant neoplasm of parotid gland: Secondary | ICD-10-CM

## 2020-03-30 DIAGNOSIS — Z8589 Personal history of malignant neoplasm of other organs and systems: Secondary | ICD-10-CM | POA: Insufficient documentation

## 2020-03-30 DIAGNOSIS — R5381 Other malaise: Secondary | ICD-10-CM

## 2020-03-30 LAB — CREATININE (CANCER CENTER ONLY)
Creatinine: 0.81 mg/dL (ref 0.44–1.00)
GFR, Estimated: >60 mL/min (ref >60)

## 2020-03-30 LAB — TSH: TSH: 1.799 u[IU]/mL (ref 0.308–3.960)

## 2020-03-30 MED ORDER — IOHEXOL 300 MG/ML  SOLN
75.0000 mL | Freq: Once | INTRAMUSCULAR | Status: AC | PRN
  Administered 2020-03-30: 75 mL via INTRAVENOUS

## 2020-03-31 ENCOUNTER — Encounter: Payer: Self-pay | Admitting: Radiation Oncology

## 2020-03-31 ENCOUNTER — Other Ambulatory Visit: Payer: Self-pay

## 2020-03-31 ENCOUNTER — Ambulatory Visit
Admission: RE | Admit: 2020-03-31 | Discharge: 2020-03-31 | Disposition: A | Discharge status: Home / Self Care | Payer: Medicare Other | Source: Ambulatory Visit | Attending: Radiation Oncology | Admitting: Radiation Oncology

## 2020-03-31 VITALS — BP 130/50 | HR 67 | Temp 97.0°F | Resp 18 | Ht 66.0 in | Wt 218.1 lb

## 2020-03-31 DIAGNOSIS — Z8589 Personal history of malignant neoplasm of other organs and systems: Secondary | ICD-10-CM | POA: Diagnosis not present

## 2020-03-31 DIAGNOSIS — Z79899 Other long term (current) drug therapy: Secondary | ICD-10-CM | POA: Insufficient documentation

## 2020-03-31 DIAGNOSIS — M47812 Spondylosis without myelopathy or radiculopathy, cervical region: Secondary | ICD-10-CM | POA: Diagnosis not present

## 2020-03-31 DIAGNOSIS — Z7982 Long term (current) use of aspirin: Secondary | ICD-10-CM | POA: Diagnosis not present

## 2020-03-31 DIAGNOSIS — C07 Malignant neoplasm of parotid gland: Secondary | ICD-10-CM

## 2020-03-31 NOTE — Progress Notes (Signed)
Radiation Oncology         [phone] ________________________________  Name: Jenna Jordan MRN: [id number]  Date: 03/31/2020  DOB: [birth year]  Follow-Up Visit Note  Outpatient  CC: Jenna Blade, MD  Jenna Baptist, MD  Diagnosis and Prior Radiotherapy:    ICD-10-CM   1. 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 None Using fluoride trays daily? Using fluoride toothpaste and sees a dentist regularly  Last ENT visit was on: 03/10/2020 Saw Dr. Leta Jordan  Other notable issues, if any: Patient continues to deal with dry mouth and lack of saliva. Reports her left jaw/ear is tender to the touch, but otherwise doesn't bother her (she denies any limitations in opening her mouth fully). Denies any symptoms of lymphedema to her neck/jaw.   Vitals:   03/31/20 1004  BP: (!) 130/50  Pulse: 67  Resp: 18  Temp: (!) 97 F (36.1 C)  SpO2: 98%                           ALLERGIES:  is allergic to ibuprofen, oxycodone-acetaminophen, statins, codeine, demerol, morphine and related, promethazine hcl, septra [bactrim], and tramadol.  Meds: Current Outpatient Medications  Medication Sig Dispense Refill  . acetaminophen (TYLENOL) 500 MG tablet Take 500 mg by mouth every  6 (six) hours as needed.    . Ascorbic Acid (VITAMIN C) 1000 MG tablet Take 1,000 mg by mouth daily.    Marland Kitchen aspirin 81 MG EC tablet Take 162 mg by mouth.     Marland Kitchen b complex vitamins tablet Take 1 tablet by mouth daily.    . Calcium Carb-Cholecalciferol (CALCIUM 1000 + D PO) Take 1 capsule by mouth daily.    . Cholecalciferol (VITAMIN D3) 1000 UNITS CAPS Take 1 capsule by mouth daily.    . fish oil-omega-3 fatty acids 1000 MG capsule Take 2 g by mouth daily.    Marland Kitchen lactobacillus acidophilus (BACID) TABS tablet Take 2 tablets by mouth daily.     . magnesium oxide (MAG-OX) 400 MG tablet Take 400 mg by mouth daily.    . meloxicam (MOBIC) 15 MG tablet Take 15 mg by mouth daily.    . sodium fluoride (PREVIDENT 5000 PLUS) 1.1 % CREA dental cream Apply to tooth brush. Brush teeth for 2 minutes. Spit out excess-DO NOT swallow. Repeat nightly. 1 Tube prn  . triamterene-hydrochlorothiazide (MAXZIDE-25) 37.5-25 MG tablet Take 1 tablet by mouth daily.     No current facility-administered medications for this encounter.    Physical Findings: The patient is in no acute distress. Patient is alert and oriented.  height is 5\' 6"  (1.676 m) and weight is 218 lb 2 oz (98.9  kg). Her temporal temperature is 97 F (36.1 C) (abnormal). Her blood pressure is 130/50 (abnormal) and her pulse is 67. Her respiration is 18 and oxygen saturation is 98%. .    Skin: intact, has healed beautifully.  HEENT: Oral cavity/upper throat are clear.  No palpable adenopathy in the facial or head and neck region. Heart: RRR, no murmurs Chest clear to auscultation bilaterally  Lab Findings: Lab Results  Component Value Date   WBC 9.1 01/06/2018   HGB 13.5 01/06/2018   HCT 41.7 01/06/2018   MCV 95.6 01/06/2018   PLT 335 01/06/2018    Radiographic Findings: CT Soft Tissue Neck W Contrast  Result Date: 03/30/2020 CLINICAL DATA:  Parotid carcinoma follow-up resection and radiation. EXAM: CT NECK WITH CONTRAST TECHNIQUE: Multidetector  CT imaging of the neck was performed using the standard protocol following the bolus administration of intravenous contrast. CONTRAST:  36mL OMNIPAQUE IOHEXOL 300 MG/ML  SOLN COMPARISON:  CT neck 09/23/2019 FINDINGS: Pharynx and larynx: Normal. No mass or swelling. Anatomy degraded by motion. Salivary glands: Partial parotidectomy on the left unchanged from the prior study. No recurrent mass or edema. Right parotid normal.  Submandibular gland normal bilaterally. Thyroid: Negative Lymph nodes: No enlarged lymph nodes in the neck. Vascular: Normal vascular enhancement. Limited intracranial: Negative Visualized orbits: Negative Mastoids and visualized paranasal sinuses: Paranasal sinuses clear. Mastoid clear. Skeleton: Cervical spondylosis.  Negative for acute abnormality. Upper chest: Lung apices clear bilaterally. Other: None IMPRESSION: 1. Stable CT neck 2. Postop left parotidectomy without recurrent tumor or lymphadenopathy. Electronically Signed   By: Franchot Gallo M.D.   On: 03/30/2020 17:14    Impression/Plan: No evidence of disease per CT scan or physical exam and doing very well symptomatically  I talked to her about our head and neck oncology survivorship program at the cancer center.  I recommend that she enroll in this in 6 months.  We will make that referral.  Also, I will see her back in 12 months.  She will continue to see otolaryngology as well.    We discussed measures to reduce the risk of infection during the COVID-19 pandemic.  She has received the [facility] vaccine and she completed her second vaccination in June.  I recommended that she get the booster shot.  She declines receiving it today.  She states she will get it with her son later at [facility].  I think this is a very good idea and encouraged her to do this in the near future to reduce her risk of morbidity from Covid.  I look forward to seeing her back next year.  She knows to call she has any questions in the future.  On date of  service, in total, I spent 25 minutes on this encounter.  Patient was seen face-to-face.    _____________________________________   Eppie Gibson, MD

## 2020-03-31 NOTE — Progress Notes (Signed)
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 None Using fluoride trays daily? Using fluoride toothpaste and sees a dentist regularly  Last ENT visit was on: 03/10/2020 Saw Dr. Leta Baptist  Other notable issues, if any: Patient continues to deal with dry mouth and lack of saliva. Reports her left jaw/ear is tender to the touch, but otherwise doesn't bother her (she denies any limitations in opening her mouth fully). Denies any symptoms of lymphedema to her neck/jaw.   Vitals:   03/31/20 1004  BP: (!) 130/50  Pulse: 67  Resp: 18  Temp: (!) 97 F (36.1 C)  SpO2: 98%

## 2020-08-29 ENCOUNTER — Telehealth: Payer: Self-pay | Admitting: Nurse Practitioner

## 2020-08-29 NOTE — Telephone Encounter (Signed)
Scheduled appt per 7/5 sch msg. Pt aware.  

## 2020-10-04 ENCOUNTER — Encounter: Payer: PRIVATE HEALTH INSURANCE | Admitting: Nurse Practitioner

## 2021-04-05 NOTE — Progress Notes (Incomplete)
Ms. Mesmer presents today for follow up after completing radiation to the left parotid on 06/15/2019  Pain issues, if any: *** Using a feeding tube?: N/A Weight changes, if any: ***  Swallowing issues, if any: *** Smoking or chewing tobacco? *** Using fluoride trays daily? ***Using fluoride toothpaste and sees a dentist regularly  Last ENT visit was on: Not since she saw Dr. Leta Baptist on 03/10/2020 Other notable issues, if any: ***

## 2021-04-06 ENCOUNTER — Ambulatory Visit: Payer: PRIVATE HEALTH INSURANCE | Admitting: Radiation Oncology

## 2022-07-31 ENCOUNTER — Other Ambulatory Visit: Payer: Self-pay | Admitting: Ophthalmology

## 2022-08-19 ENCOUNTER — Other Ambulatory Visit (HOSPITAL_COMMUNITY): Payer: Self-pay | Admitting: Internal Medicine

## 2022-08-19 DIAGNOSIS — E785 Hyperlipidemia, unspecified: Secondary | ICD-10-CM

## 2022-08-21 ENCOUNTER — Ambulatory Visit (HOSPITAL_COMMUNITY)
Admission: RE | Admit: 2022-08-21 | Discharge: 2022-08-21 | Disposition: A | Discharge status: Home / Self Care | Payer: PRIVATE HEALTH INSURANCE | Source: Ambulatory Visit | Attending: Internal Medicine | Admitting: Internal Medicine

## 2022-08-21 DIAGNOSIS — E785 Hyperlipidemia, unspecified: Secondary | ICD-10-CM | POA: Insufficient documentation

## 2022-09-17 ENCOUNTER — Other Ambulatory Visit: Payer: Self-pay | Admitting: Internal Medicine

## 2022-09-17 DIAGNOSIS — Z1231 Encounter for screening mammogram for malignant neoplasm of breast: Secondary | ICD-10-CM

## 2022-09-20 ENCOUNTER — Other Ambulatory Visit: Payer: Self-pay | Admitting: Internal Medicine

## 2022-09-20 ENCOUNTER — Ambulatory Visit
Admission: RE | Admit: 2022-09-20 | Discharge: 2022-09-20 | Disposition: A | Discharge status: Home / Self Care | Payer: Medicare Other | Source: Ambulatory Visit | Attending: Internal Medicine | Admitting: Internal Medicine

## 2022-09-20 DIAGNOSIS — Z1231 Encounter for screening mammogram for malignant neoplasm of breast: Secondary | ICD-10-CM

## 2022-10-04 ENCOUNTER — Emergency Department (HOSPITAL_COMMUNITY)
Admission: EM | Admit: 2022-10-04 | Discharge: 2022-10-04 | Disposition: A | Discharge status: Home / Self Care | Payer: Medicare Other | Attending: Emergency Medicine | Admitting: Emergency Medicine

## 2022-10-04 ENCOUNTER — Other Ambulatory Visit: Payer: Self-pay

## 2022-10-04 ENCOUNTER — Emergency Department (HOSPITAL_COMMUNITY): Payer: Medicare Other

## 2022-10-04 DIAGNOSIS — I10 Essential (primary) hypertension: Secondary | ICD-10-CM | POA: Insufficient documentation

## 2022-10-04 DIAGNOSIS — Z79899 Other long term (current) drug therapy: Secondary | ICD-10-CM | POA: Insufficient documentation

## 2022-10-04 DIAGNOSIS — G510 Bell's palsy: Secondary | ICD-10-CM | POA: Diagnosis not present

## 2022-10-04 DIAGNOSIS — Z853 Personal history of malignant neoplasm of breast: Secondary | ICD-10-CM | POA: Diagnosis not present

## 2022-10-04 DIAGNOSIS — Z7982 Long term (current) use of aspirin: Secondary | ICD-10-CM | POA: Insufficient documentation

## 2022-10-04 DIAGNOSIS — Z8673 Personal history of transient ischemic attack (TIA), and cerebral infarction without residual deficits: Secondary | ICD-10-CM | POA: Diagnosis not present

## 2022-10-04 DIAGNOSIS — R29810 Facial weakness: Secondary | ICD-10-CM | POA: Diagnosis present

## 2022-10-04 LAB — COMPREHENSIVE METABOLIC PANEL
ALT: 24 U/L (ref 0–44)
AST: 30 U/L (ref 15–41)
Albumin: 4 g/dL (ref 3.5–5.0)
Alkaline Phosphatase: 71 U/L (ref 38–126)
Anion gap: 9 (ref 5–15)
BUN: 20 mg/dL (ref 8–23)
CO2: 27 mmol/L (ref 22–32)
Calcium: 9.9 mg/dL (ref 8.9–10.3)
Chloride: 102 mmol/L (ref 98–111)
Creatinine, Ser: 0.85 mg/dL (ref 0.44–1.00)
GFR, Estimated: >60 mL/min (ref >60)
Glucose, Bld: 105 mg/dL — ABNORMAL HIGH (ref 70–99)
Potassium: 3.7 mmol/L (ref 3.5–5.1)
Sodium: 138 mmol/L (ref 135–145)
Total Bilirubin: 0.7 mg/dL (ref 0.3–1.2)
Total Protein: 7.7 g/dL (ref 6.5–8.1)

## 2022-10-04 LAB — RAPID URINE DRUG SCREEN, HOSP PERFORMED
Amphetamines: NOT DETECTED
Barbiturates: NOT DETECTED
Benzodiazepines: NOT DETECTED
Cocaine: NOT DETECTED
Opiates: NOT DETECTED
Tetrahydrocannabinol: NOT DETECTED

## 2022-10-04 LAB — CBC WITH DIFFERENTIAL/PLATELET
Abs Immature Granulocytes: 0.02 10*3/uL (ref 0.00–0.07)
Basophils Absolute: 0.1 10*3/uL (ref 0.0–0.1)
Basophils Relative: 1 %
Eosinophils Absolute: 0.2 10*3/uL (ref 0.0–0.5)
Eosinophils Relative: 3 %
HCT: 42.8 % (ref 36.0–46.0)
Hemoglobin: 14.4 g/dL (ref 12.0–15.0)
Immature Granulocytes: 0 %
Lymphocytes Relative: 21 %
Lymphs Abs: 1.7 10*3/uL (ref 0.7–4.0)
MCH: 31.4 pg (ref 26.0–34.0)
MCHC: 33.6 g/dL (ref 30.0–36.0)
MCV: 93.2 fL (ref 80.0–100.0)
Monocytes Absolute: 0.6 10*3/uL (ref 0.1–1.0)
Monocytes Relative: 7 %
Neutro Abs: 5.4 10*3/uL (ref 1.7–7.7)
Neutrophils Relative %: 68 %
Platelets: 226 10*3/uL (ref 150–400)
RBC: 4.59 MIL/uL (ref 3.87–5.11)
RDW: 12.5 % (ref 11.5–15.5)
WBC: 7.9 10*3/uL (ref 4.0–10.5)
nRBC: 0 % (ref 0.0–0.2)

## 2022-10-04 LAB — URINALYSIS, ROUTINE W REFLEX MICROSCOPIC
Bacteria, UA: NONE SEEN
Bilirubin Urine: NEGATIVE
Glucose, UA: NEGATIVE mg/dL
Hgb urine dipstick: NEGATIVE
Ketones, ur: NEGATIVE mg/dL
Nitrite: NEGATIVE
Protein, ur: NEGATIVE mg/dL
Specific Gravity, Urine: 1.014 (ref 1.005–1.030)
pH: 5 (ref 5.0–8.0)

## 2022-10-04 LAB — PROTIME-INR
INR: 1 (ref 0.8–1.2)
Prothrombin Time: 13.3 seconds (ref 11.4–15.2)

## 2022-10-04 LAB — I-STAT CG4 LACTIC ACID, ED: Lactic Acid, Venous: 1.7 mmol/L (ref 0.5–1.9)

## 2022-10-04 LAB — CBG MONITORING, ED
Glucose-Capillary: 105 mg/dL — ABNORMAL HIGH (ref 70–99)
Glucose-Capillary: 127 mg/dL — ABNORMAL HIGH (ref 70–99)

## 2022-10-04 LAB — APTT: aPTT: 25 seconds (ref 24–36)

## 2022-10-04 MED ORDER — TETRACAINE HCL 0.5 % OP SOLN
1.0000 [drp] | Freq: Once | OPHTHALMIC | Status: DC
  Filled 2022-10-04: qty 4

## 2022-10-04 MED ORDER — FLUORESCEIN SODIUM 1 MG OP STRP
1.0000 | ORAL_STRIP | Freq: Once | OPHTHALMIC | Status: DC
  Filled 2022-10-04: qty 1

## 2022-10-04 MED ORDER — VALACYCLOVIR HCL 1 G PO TABS: 1000.0000 mg | ORAL_TABLET | Freq: Three times a day (TID) | ORAL | 0 refills | Status: AC

## 2022-10-04 MED ORDER — PREDNISONE 10 MG PO TABS: 60.0000 mg | ORAL_TABLET | Freq: Every day | ORAL | 0 refills | Status: AC

## 2022-10-04 NOTE — ED Provider Notes (Signed)
I provided a substantive portion of the care of this patient.  I personally made/approved the management plan for this patient and take responsibility for the patient management.  EKG Interpretation Date/Time:  Friday October 04 2022 11:11:13 EDT Ventricular Rate:  90 PR Interval:  184 QRS Duration:  109 QT Interval:  364 QTC Calculation: 446 R Axis:   73  Text Interpretation: Sinus rhythm Probable anterior infarct, age indeterminate No acute changes No significant change since last tracing Confirmed by Derwood Kaplan [phone]) on 10/04/2022 1:54:03 PM  Patient seen by me along with physician assistant.  Patient presenting with facial weakness with sparing of the forehead.  So clinically not clear that it is a Bell's palsy.  More concerning for CVA.  Symptom onset was 8 hours ago so she is out of the window for tPA.  Patient's labs here without significant abnormality CBC was normal complete metabolic panel normal blood sugar was 127.  Urine a little cloudy but not consistent with urinary tract infection.  CT head no acute intracranial findings.  Discussed with Dr. Jerrell Belfast on-call for teleneurology who recommended MRI.  Disposition will be based on this.  Will recontact him once MRI results are back.  If positive for CVA will need admission to [facility].  CRITICAL CARE Performed by: Vanetta Mulders Total critical care time: 45 minutes Critical care time was exclusive of separately billable procedures and treating other patients. Critical care was necessary to treat or prevent imminent or life-threatening deterioration. Critical care was time spent personally by me on the following activities: development of treatment plan with patient and/or surrogate as well as nursing, discussions with consultants, evaluation of patient's response to treatment, examination of patient, obtaining history from patient or surrogate, ordering and performing treatments and interventions, ordering and review of laboratory  studies, ordering and review of radiographic studies, pulse oximetry and re-evaluation of patient's condition.    Vanetta Mulders, MD 10/04/22 [phone]

## 2022-10-04 NOTE — ED Provider Notes (Signed)
[place] [facility]ERGENCY DEPARTMENT AT [facility] Provider Note   CSN: [id number] Arrival date & time: 10/04/22  1105     History  Chief Complaint  Patient presents with   Facial Droop    Jenna Jordan is a 82 y.o. female history of breast cancer, kidney stones, hypertension, left parotid gland carcinoma presented with strokelike symptoms that began at 7 AM this morning.  Last known well was midnight.  Patient states that she was brushing her teeth and went to spit when she noticed that her mouth was drooping on the right side.  Patient denies any other symptoms but is concerned that she is having a stroke.  Patient has previous history of strokes or blood clots.  Patient denies head pain, neck pain, vision changes, change in sensation/motor skills, chest pain, shortness of breath.   Home Medications Prior to Admission medications   Medication Sig Start Date End Date Taking? Authorizing Provider  acetaminophen (TYLENOL) 500 MG tablet Take 500 mg by mouth every 6 (six) hours as needed.   Yes [provider]  Ascorbic Acid (VITAMIN C) 1000 MG tablet Take 1,000 mg by mouth daily.   Yes [provider]  aspirin 81 MG EC tablet Take 162 mg by mouth daily.   Yes [provider]  b complex vitamins tablet Take 1 tablet by mouth daily.   Yes [provider]  Cholecalciferol (VITAMIN D3) 1000 UNITS CAPS Take 1 capsule by mouth daily.   Yes [provider]  fish oil-omega-3 fatty acids 1000 MG capsule Take 2 g by mouth daily.   Yes [provider]  lactobacillus acidophilus (BACID) TABS tablet Take 2 tablets by mouth daily.    Yes [provider]  magnesium oxide (MAG-OX) 400 MG tablet Take 400 mg by mouth daily.   Yes [provider]  meloxicam (MOBIC) 7.5 MG tablet Take 7.5 mg by mouth daily.   Yes [provider]  triamterene-hydrochlorothiazide (MAXZIDE-25) 37.5-25 MG tablet Take 1 tablet by mouth daily.    Yes [provider]  furosemide (LASIX) 20 MG tablet Take 20 mg by mouth daily as needed.    [provider]  sodium fluoride (PREVIDENT 5000 PLUS) 1.1 % CREA dental cream Apply to tooth brush. Brush teeth for 2 minutes. Spit out excess-DO NOT swallow. Repeat nightly. Patient not taking: Reported on 10/04/2022 04/21/19   Charlynne Pander, DDS      Allergies    Ibuprofen, Oxycodone-acetaminophen, Statins, Codeine, Demerol, Morphine and codeine, Promethazine hcl, Septra [bactrim], and Tramadol    Review of Systems   Review of Systems See HPI Physical Exam Updated Vital Signs BP (!) 153/86   Pulse 72   Temp 97.9 F (36.6 C) (Oral)   Resp 18   Ht 5\' 6"  (1.676 m)   Wt 98.9 kg   SpO2 97%   BMI 35.19 kg/m  Physical Exam Constitutional:      General: She is not in acute distress. Cardiovascular:     Rate and Rhythm: Normal rate and regular rhythm.     Pulses: Normal pulses.     Heart sounds: Normal heart sounds.  Pulmonary:     Effort: Pulmonary effort is normal. No respiratory distress.     Breath sounds: Normal breath sounds.  Musculoskeletal:     Cervical back: Normal range of motion.  Skin:    General: Skin is warm and dry.     Capillary Refill: Capillary refill takes less than 2 seconds.  Neurological:     Mental Status: She is alert.     Sensory: Sensation is intact.     Motor: Motor function is intact.     Coordination: Coordination is intact.     Comments: Right-sided facial droop, able to wrinkle right forehead, able to blink Vision grossly intact  Psychiatric:        Mood and Affect: Mood normal.     ED Results / Procedures / Treatments   Labs (all labs ordered are listed, but only abnormal results are displayed) Labs Reviewed  COMPREHENSIVE METABOLIC PANEL - Abnormal; Notable for the following components:      Result Value   Glucose, Bld 105 (*)    All other components within normal limits  URINALYSIS, ROUTINE W REFLEX MICROSCOPIC -  Abnormal; Notable for the following components:   Color, Urine AMBER (*)    APPearance CLOUDY (*)    Leukocytes,Ua SMALL (*)    All other components within normal limits  CBG MONITORING, ED - Abnormal; Notable for the following components:   Glucose-Capillary 105 (*)    All other components within normal limits  CBG MONITORING, ED - Abnormal; Notable for the following components:   Glucose-Capillary 127 (*)    All other components within normal limits  CBC WITH DIFFERENTIAL/PLATELET  PROTIME-INR  APTT  RAPID URINE DRUG SCREEN, HOSP PERFORMED  I-STAT CHEM 8, ED  I-STAT CG4 LACTIC ACID, ED    EKG EKG Interpretation Date/Time:  Friday October 04 2022 11:11:13 EDT Ventricular Rate:  90 PR Interval:  184 QRS Duration:  109 QT Interval:  364 QTC Calculation: 446 R Axis:   73  Text Interpretation: Sinus rhythm Probable anterior infarct, age indeterminate No acute changes No significant change since last tracing Confirmed by Derwood Kaplan [phone] on 10/04/2022 1:54:03 PM  Radiology CT Head Wo Contrast  Result Date: 10/04/2022 CLINICAL DATA:  Right facial droop EXAM: CT HEAD WITHOUT CONTRAST TECHNIQUE: Contiguous axial images were obtained from the base of the skull through the vertex without intravenous contrast. RADIATION DOSE REDUCTION: This exam was performed according to the departmental dose-optimization program which includes automated exposure control, adjustment of the mA and/or kV according to patient size and/or use of iterative reconstruction technique. COMPARISON:  Images of previous study done on 09/29/2000 are not available for review. Report for the previous study was reviewed. FINDINGS: Brain: No acute intracranial findings are seen in noncontrast CT brain. There are no signs of bleeding within the cranium. Cortical sulci are prominent. Ventricles are not dilated. Vascular: Scattered arterial calcifications are seen. Skull: No acute findings are seen. Hyperostosis frontalis  interna is seen. Sinuses/Orbits: Unremarkable. Other: None. IMPRESSION: No acute intracranial findings are seen in noncontrast CT brain. Atrophy. Electronically Signed   By: Ernie Avena M.D.   On: 10/04/2022 14:49    Procedures .Critical Care  Performed by: Jenna Corrigan, PA-C Authorized by: Jenna Corrigan, PA-C   Critical care provider statement:    Critical care time (minutes):  30   Critical care time was exclusive of:  Separately billable procedures and treating other patients   Critical care was necessary to treat or prevent imminent or life-threatening deterioration of the following conditions:  CNS failure or compromise   Critical care was time spent personally by me on the following activities:  Blood draw for specimens, development of treatment plan with patient or surrogate, discussions with consultants, evaluation of patient's response to treatment, examination of patient, obtaining history from patient or surrogate, review  of old charts, pulse oximetry, re-evaluation of patient's condition, ordering and review of radiographic studies, ordering and review of laboratory studies and ordering and performing treatments and interventions   I assumed direction of critical care for this patient from another provider in my specialty: no     Care discussed with comment:  Neurologist     Medications Ordered in ED Medications - No data to display  ED Course/ Medical Decision Making/ A&P                                 Medical Decision Making Amount and/or Complexity of Data Reviewed Labs: ordered. Radiology: ordered.   Jenna Jordan 82 y.o. presented today for strokelike symptoms. Working DDx that I considered at this time includes, but not limited to, CVA/TIA, Bell's palsy, electrolyte abnormalities, hypoglycemia, anemia, carotid artery dissection.  R/o DDx: pending  Review of prior external notes: 01/30/2022 office visit  Unique Tests and My Interpretation:  CBG:  127 APTT:Unremarkable I-STAT Chem-8:Unremarkable HQI:[id number] GM:[id number] UDS: Unremarkable PT/INR:Unremarkable IHK:[id number] CT head without contrast: EKG: Sinus 90 bpm, no ST elevations or depressions, no blocks noted  Discussion with Independent Historian:  Daughter  Discussion of Management of Tests:  Jenna Corner, MD Neurologist  Risk: pending  Risk Stratification Score: None  Staffed with Deretha Emory, MD  Plan: On exam patient was in no acute distress stable vitals.  Patient was evaluated in triage and patient did have a right-sided facial droop and was able to blink her eyes and wrinkle her right forehead.  Patient is outside the window for any intervention if CVA and so no code stroke was initiated per Dr. Deretha Emory however code medical ordered for patient CT scan.  Stroke panel was ordered as well.  Patient stable at this time.  Neurology was consulted and stated that even though the CT has not been read yet that there could be a possible subacute stroke per neurologist read and to proceed with MRI without contrast.  If the MRI is positive for stroke then admit to hospitalist at [facility] and they will follow in consultation.  If negative reach back out to see if patient may be seen outpatient or determine if hospitalization is necessary.  Patient signed out to [employer], [employer].  The plan is to follow-up in the MRI without contrast and to dispo accordingly with the plan listed above.         Final Clinical Impression(s) / ED Diagnoses Final diagnoses:  None    Rx / DC Orders ED Discharge Orders     None         Remi Deter 10/04/22 1541    Vanetta Mulders, MD 10/04/22 1655

## 2022-10-04 NOTE — Plan of Care (Addendum)
On call neurology note  Pt with sudden onset of rt facial droop. No neglect. OSW for TNK.  Recs: MRI brain w/o Call back for further recs once done.  Addendum MRI negative Further history-patient has been having some facial pain and now has incomplete facial droop-likely incomplete Bell's palsy. Treat as Bell's palsy and follow-up with outpatient neurology  -- Milon Dikes, MD Neurologist [facility] Pager: [phone]

## 2022-10-04 NOTE — ED Provider Notes (Signed)
Received handoff from Evlyn Kanner PA. Patient w/L Sided facial droop, LKN 12AM. If positive MRI, admit to [facility]. If negative reach back out to neurology (Dr. Wilford Corner).   Pending MRI for dispo  Physical Exam  BP (!) 153/86   Pulse 72   Temp 97.9 F (36.6 C) (Oral)   Resp 18   Ht 5\' 6"  (1.676 m)   Wt 98.9 kg   SpO2 97%   BMI 35.19 kg/m   Physical Exam Vitals and nursing note reviewed.  Constitutional:      General: She is not in acute distress.    Appearance: She is well-developed.  HENT:     Head: Normocephalic and atraumatic.  Eyes:     General: Lids are normal. Lids are everted, no foreign bodies appreciated. Vision grossly intact. Gaze aligned appropriately.     Extraocular Movements: Extraocular movements intact.     Conjunctiva/sclera: Conjunctivae normal.     Comments: Fluorescein exam negative for any kind of uptake.  Mild erythematous conjunctiva of right eye.  No lesions noted to the skin  Cardiovascular:     Rate and Rhythm: Normal rate and regular rhythm.     Heart sounds: No murmur heard. Pulmonary:     Effort: Pulmonary effort is normal. No respiratory distress.     Breath sounds: Normal breath sounds.  Abdominal:     Palpations: Abdomen is soft.     Tenderness: There is no abdominal tenderness.  Musculoskeletal:        General: No swelling.     Cervical back: Neck supple.  Skin:    General: Skin is warm and dry.     Capillary Refill: Capillary refill takes less than 2 seconds.  Neurological:     Mental Status: She is alert.     Comments: Right-sided facial droop, with ability to wrinkle eyebrow slightly.  Good sensation to this side of the face.  Endorses eye discomfort, but vision intact.  Psychiatric:        Mood and Affect: Mood normal.     Procedures  Procedures  ED Course / MDM    Medical Decision Making Amount and/or Complexity of Data Reviewed Labs: ordered. Radiology: ordered.  Risk Prescription drug management.   I spoke with Dr.  Wilford Corner, secondary to negative MRI.  He thinks that this patient may have a partial Bell's palsy, recommends treating as such, no need for transfer for neuroevaluation.  I discussed with the patient, her eye exam was negative, for any kind of fluorescein uptake.  Vision is intact.  I believe that this irritation is secondary to dry eye, discussed follow-up with primary care doctor, and started on steroids, as well as acyclovir.      Pete Pelt, [place] 10/04/22 1818    Loetta Rough, MD 10/04/22 [phone]

## 2022-10-04 NOTE — ED Triage Notes (Signed)
Pt reports right sided facial drooping, noticed around 10am, last known well around midnight. Asymmetry when smiling, closing eyes, raising eyebrows, and puffing out cheeks. Reports right eye burning since Sunday along with possible lymph node swelling in neck. Tried new makeup Saturday.

## 2022-10-04 NOTE — Discharge Instructions (Addendum)
Please tape your eye shut at night, to help prevent dryness.  You can use over-the-counter, eyedrops, for lubrication, to help with your eye pain.  Take the acyclovir as instructed, and prednisone.  Your symptoms should improve within the next 2 weeks, but if they are not further improving, I recommend that you see your primary care doctor.  Return to the ER if you have any changes in vision, confusion, or fevers.

## 2022-10-04 NOTE — ED Provider Triage Note (Signed)
Emergency Medicine Provider Triage Evaluation Note  Jenna Jordan , a 82 y.o. female  was evaluated in triage.  Pt complains of strokelike symptoms that began at 7 AM this morning.  Last known well was midnight.  Patient states that she was brushing her teeth and went to spit when she noticed that her mouth was drooping on the right side.  Patient denies any other symptoms but is concerned that she is having a stroke.  Patient has previous history of strokes or blood clots.  Patient denies head pain, neck pain, vision changes, change in sensation/motor skills, chest pain, shortness of breath.  Review of Systems  Positive: See HPI Negative: See HPI  Physical Exam  BP (!) 151/93 (BP Location: Right Arm)   Pulse 98   Temp 97.9 F (36.6 C) (Oral)   Resp 18   Ht 5\' 6"  (1.676 m)   Wt 98.9 kg   SpO2 95%   BMI 35.19 kg/m  Gen:   Awake, no distress   Resp:  Normal effort  MSK:   Moves extremities without difficulty  Other:  Right-sided facial droop, able to wrinkle forehead on right side, able to blink on right side, no slurring of words noted  Medical Decision Making  Medically screening exam initiated at 11:35 AM.  Appropriate orders placed.  Jenna Jordan was informed that the remainder of the evaluation will be completed by another provider, this initial triage assessment does not replace that evaluation, and the importance of remaining in the ED until their evaluation is complete.  Workup initiated, suspect stroke however patient is outside of the window for any intervention and so the stroke order set was used without the code stroke status as per Dr. Deretha Emory recommendation   Netta Corrigan, PA-C 10/04/22 1137

## 2022-10-21 IMAGING — CT CT NECK W/ CM
5 series · 14 of 35 positions shown, 16 images · IV contrast (omnipaque)
Comparison: CT neck 09/23/2019

CLINICAL DATA: Parotid carcinoma follow-up resection and radiation.

EXAM:
CT NECK WITH CONTRAST
TECHNIQUE: Multidetector CT imaging of the neck was performed using the
standard protocol following the bolus administration of intravenous
contrast.
CONTRAST:  75mL OMNIPAQUE IOHEXOL 300 MG/ML  SOLN

[Series 2: axial neck · axial · 0.41mm/px · z∈[+1232,+1364]mm · 3 of 133 slices shown, 4 images]
[im 34/133  soft-tissue]
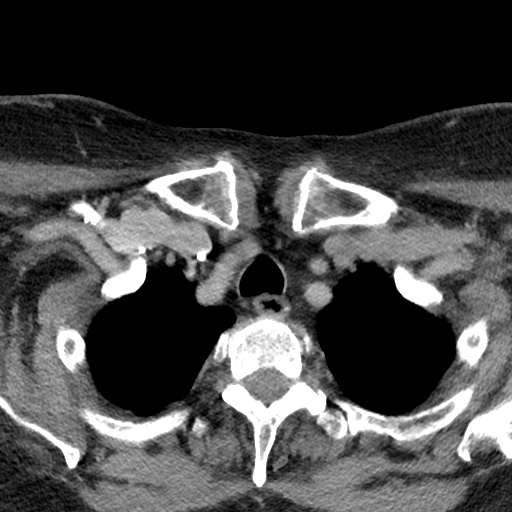
[im 34/133  bone]
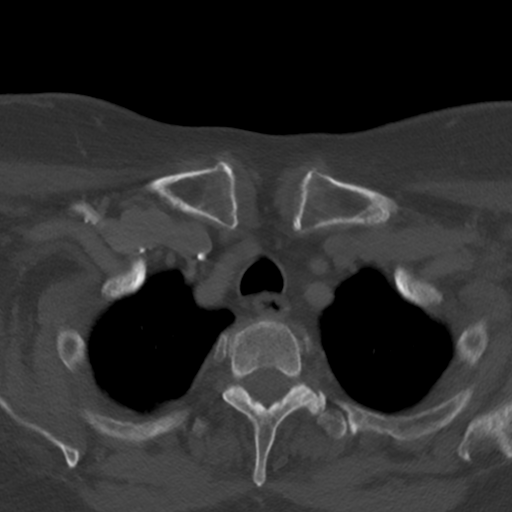
[im 67/133  bone]
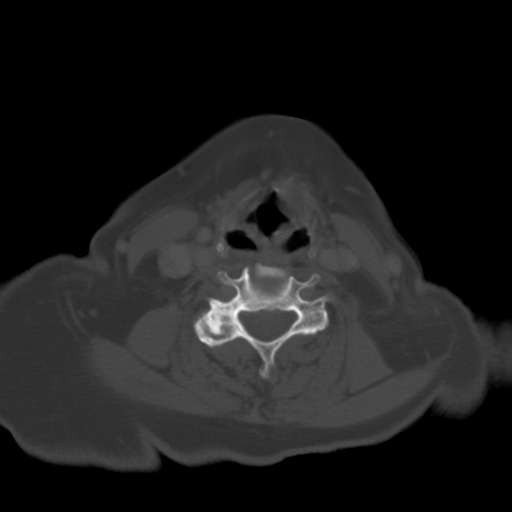
[im 100/133  bone]
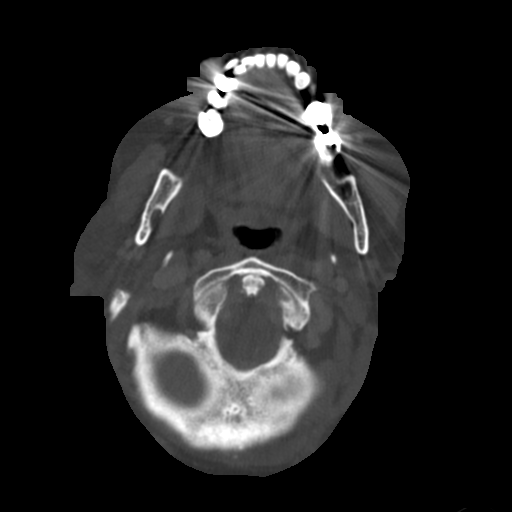

[Series 4: axial bone · axial · 0.41mm/px · z∈[+1254,+1342]mm · 2 of 133 slices shown]
[im 45/133  bone]
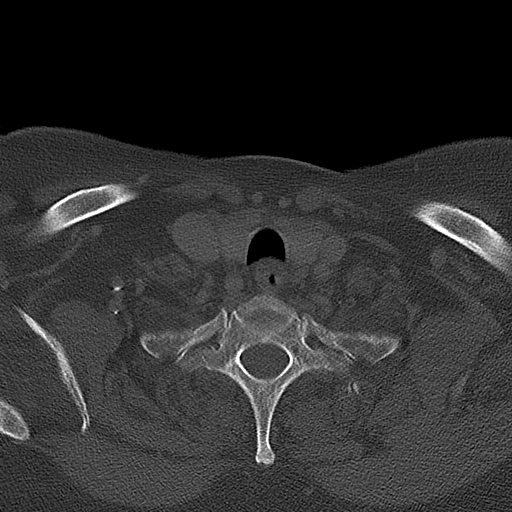
[im 89/133  bone]
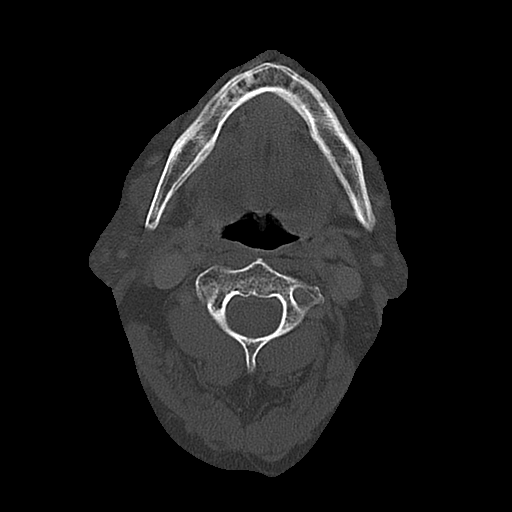

[Series 5: orthogonal (person_name) · axial · 0.39mm/px · 1 of 149 slices shown]
[im 38/149  bone]
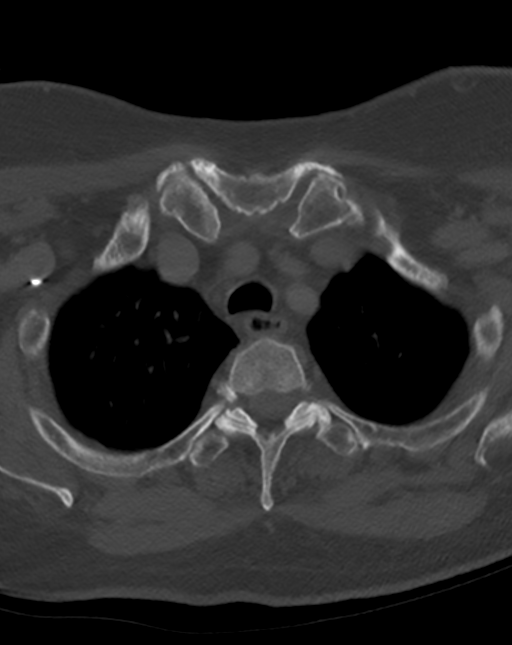

[Series 6: cor neck · coronal · 0.39mm/px · 3 of 121 slices shown]
[im 25/121  bone]
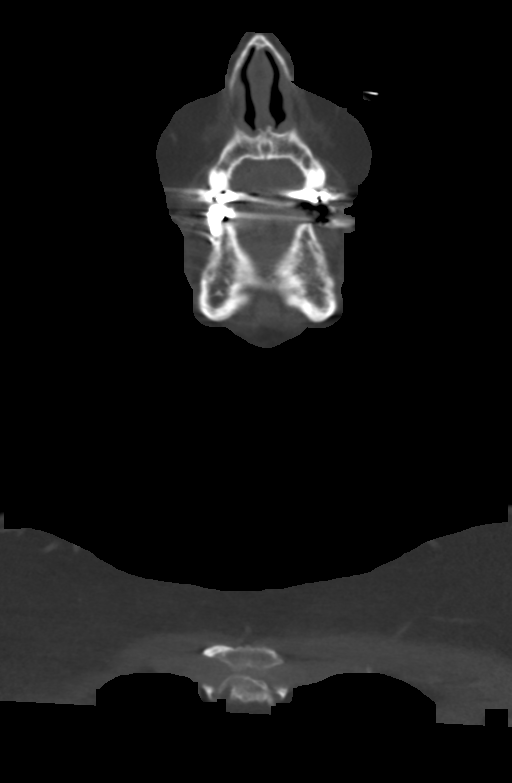
[im 49/121  bone]
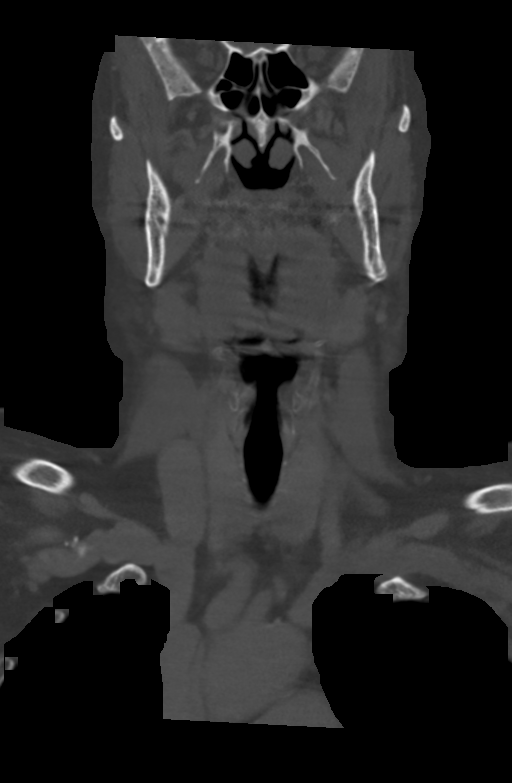
[im 73/121  bone]
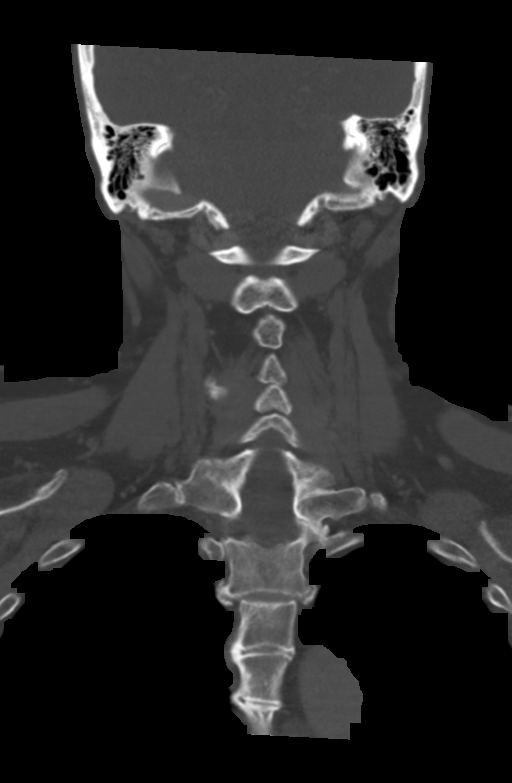

[Series 7: sag neck · sagittal · 0.49mm/px · 5 of 101 slices shown, 6 images]
[im 34/101  bone]
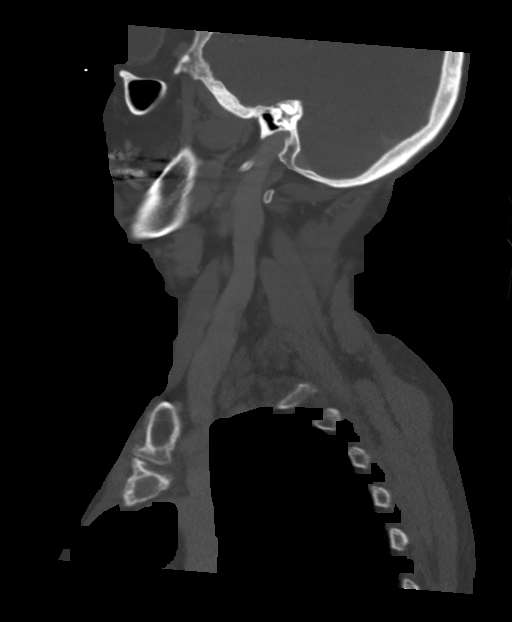
[im 42/101  bone]
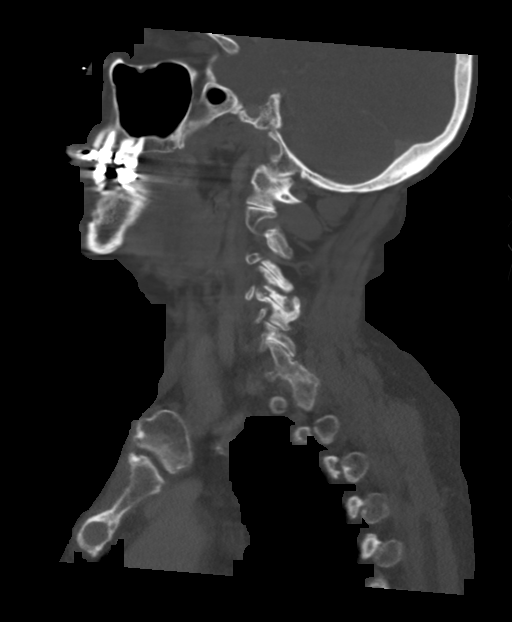
[im 51/101  soft-tissue]
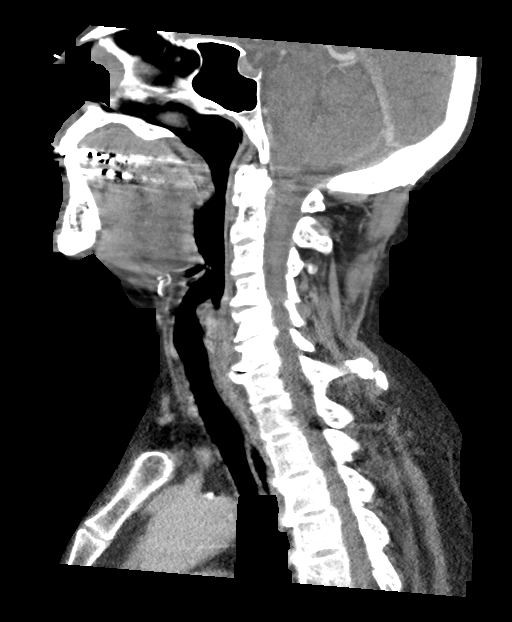
[im 51/101  bone]
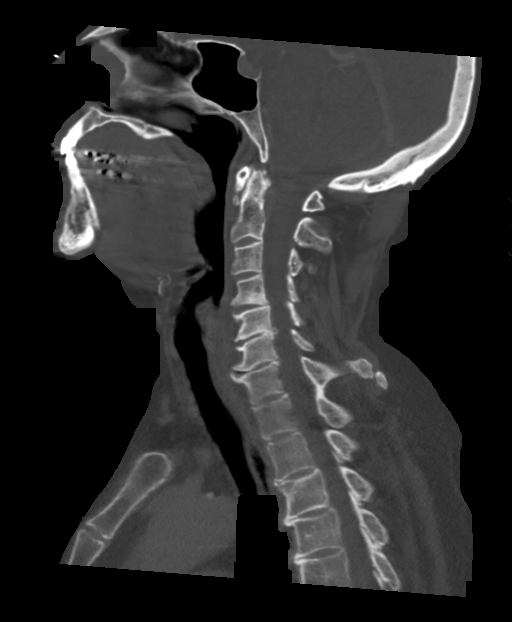
[im 59/101  bone]
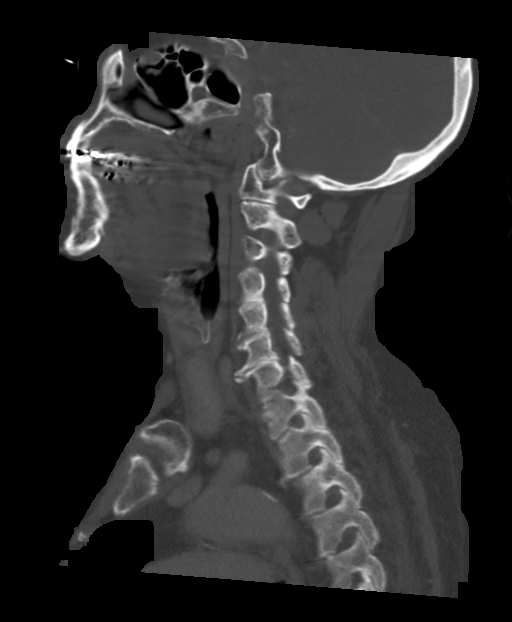
[im 67/101  bone]
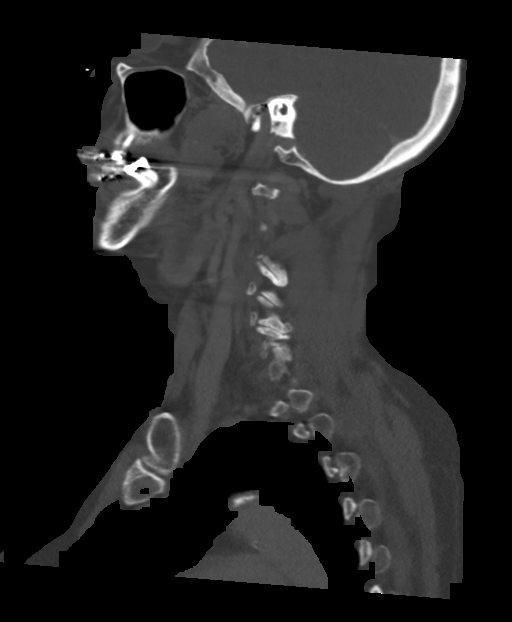

[14 of 35 positions shown; findings below may reference images not displayed]

FINDINGS: Pharynx and larynx: Normal. No mass or swelling. Anatomy degraded by
motion.

Salivary glands: Partial parotidectomy on the left unchanged from
the prior study. No recurrent mass or edema.

Right parotid normal.  Submandibular gland normal bilaterally.

Thyroid: Negative

Lymph nodes: No enlarged lymph nodes in the neck.

Vascular: Normal vascular enhancement.

Limited intracranial: Negative

Visualized orbits: Negative

Mastoids and visualized paranasal sinuses: Paranasal sinuses clear.
Mastoid clear.

Skeleton: Cervical spondylosis.  Negative for acute abnormality.

Upper chest: Lung apices clear bilaterally.

Other: None
IMPRESSION: 1. Stable CT neck
2. Postop left parotidectomy without recurrent tumor or
lymphadenopathy.

## 2022-12-18 ENCOUNTER — Ambulatory Visit (INDEPENDENT_AMBULATORY_CARE_PROVIDER_SITE_OTHER): Payer: PRIVATE HEALTH INSURANCE | Admitting: Audiology

## 2023-06-03 ENCOUNTER — Ambulatory Visit (INDEPENDENT_AMBULATORY_CARE_PROVIDER_SITE_OTHER): Payer: PRIVATE HEALTH INSURANCE

## 2023-06-03 VITALS — BP 121/63 | HR 79 | Ht 62.0 in | Wt 224.0 lb

## 2023-06-03 DIAGNOSIS — H903 Sensorineural hearing loss, bilateral: Secondary | ICD-10-CM

## 2023-06-03 DIAGNOSIS — H6122 Impacted cerumen, left ear: Secondary | ICD-10-CM | POA: Diagnosis not present

## 2023-06-03 DIAGNOSIS — C07 Malignant neoplasm of parotid gland: Secondary | ICD-10-CM

## 2023-06-03 DIAGNOSIS — H6123 Impacted cerumen, bilateral: Secondary | ICD-10-CM

## 2023-06-03 NOTE — Progress Notes (Unsigned)
 Patient ID: Jenna Jordan, female   DOB: [birth year], 83 y.o.   MRN: [id number]  Follow-up: Left parotid cancer, hearing loss  HPI: The patient is an 83 year old female who returns today for her follow-up evaluation.  The patient has a history of left parotid cancer.  She underwent left parotidectomy surgery in 2021.  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  She was treated with radiation therapy.  At her last visit 1 year ago, she was also noted to have bilateral high-frequency sensorineural hearing loss, likely secondary to routine presbycusis.

## 2023-06-04 DIAGNOSIS — H6123 Impacted cerumen, bilateral: Secondary | ICD-10-CM | POA: Insufficient documentation

## 2023-06-04 DIAGNOSIS — H903 Sensorineural hearing loss, bilateral: Secondary | ICD-10-CM | POA: Insufficient documentation
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
## 2023-12-21 NOTE — Progress Notes (Signed)
 COVID Vaccine received:  []  No [x]  Yes Date of any COVID positive Test in last 90 days:  PCP - Suzen Lamer, MD clearance scanned to [facility] 12-05-23 Cardiologist -   Chest x-ray -  EKG - 09-2022   repeat  Stress Test -  ECHO -  Cardiac Cath -  CT Coronary Calcium  score: 259 on 08-21-2022   Pacemaker / ICD device [x]  No []  Yes   Spinal Cord Stimulator:[x]  No []  Yes       History of Sleep Apnea? [x]  No []  Yes   CPAP used?- [x]  No []  Yes    Patient has: []  NO Hx DM   [x]  Pre-DM   []  DM1  []   DM2 Does the patient monitor blood sugar?   []  N/A   [x]  No []  Yes  Last A1c was: 6.0  on   08-12-23     Blood Thinner / Instructions: none Aspirin Instructions:  none  Dental hx: []  Dentures:  []  N/A      []  Bridge or Partial:                   []  Loose or Damaged teeth:   Comments:   Activity level: Able to walk up 2 flights of stairs without becoming significantly short of breath or having chest pain?  []  No   []    Yes  Patient can perform ADLs without assistance. []  No   []   Yes  Anesthesia review: HTN, Pre-DM- no meds, PONV, HOH-   Patient denies any S&S of respiratory illness or Covid - no shortness of breath, fever, cough or chest pain at PAT appointment.  Patient verbalized understanding and agreement to the Pre-Surgical Instructions that were given to them at this PAT appointment. Patient was also educated of the need to review these PAT instructions again prior to her surgery.I reviewed the appropriate phone numbers to call if they have any and questions or concerns.

## 2023-12-21 NOTE — Patient Instructions (Signed)
 SURGICAL WAITING ROOM VISITATION Patients having surgery or a procedure may have no more than 2 support people in the waiting area - these visitors may rotate in the visitor waiting room.   If the patient needs to stay at the hospital during part of their recovery, the visitor guidelines for inpatient rooms apply.  PRE-OP VISITATION  Pre-op nurse will coordinate an appropriate time for 1 support person to accompany the patient in pre-op.  This support person may not rotate.  This visitor will be contacted when the time is appropriate for the visitor to come back in the pre-op area.  Please refer to the [facility] website for the visitor guidelines for Inpatients (after your surgery is over and you are in a regular room).  You are not required to quarantine at this time prior to your surgery. However, you must do this: Hand Hygiene often Do NOT share personal items Notify your provider if you are in close contact with someone who has COVID or you develop fever 100.4 or greater, new onset of sneezing, cough, sore throat, shortness of breath or body aches.  If you test positive for Covid or have been in contact with anyone that has tested positive in the last 10 days please notify you surgeon.    Your procedure is scheduled on:  FRIDAY  January 02, 2024  Report to [facility] Main Entrance: [address] entrance where the [employer] is available.   Report to admitting at: 06:30  AM  Call this number if you have any questions or problems the morning of surgery [phone]  Do not eat food after Midnight the night prior to your surgery/procedure.  After Midnight you may have the following liquids until  06:00 AM DAY OF SURGERY  Clear Liquid Diet Water Black Coffee (sugar ok, NO MILK/CREAM OR CREAMERS)  Tea (sugar ok, NO MILK/CREAM OR CREAMERS) regular and decaf                             Plain Jell-O  with no fruit (NO RED)                                           Fruit ices  (not with fruit pulp, NO RED)                                     Popsicles (NO RED)                                                                  Juice: NO CITRUS JUICES: only apple, WHITE grape, WHITE cranberry Sports drinks like Gatorade or Powerade (NO RED)                   The day of surgery:  Drink ONE (1) Pre-Surgery G2 at   06:00 AM the morning of surgery. Drink in one sitting. Do not sip.  This drink was given to you during your hospital pre-op appointment visit. Nothing else to drink after completing the Pre-Surgery  G2 : No candy, chewing gum or throat lozenges.    FOLLOW ANY ADDITIONAL PRE OP INSTRUCTIONS YOU RECEIVED FROM YOUR SURGEON'S OFFICE!!!   Oral Hygiene is also important to reduce your risk of infection.        Remember - BRUSH YOUR TEETH THE MORNING OF SURGERY WITH YOUR REGULAR TOOTHPASTE  Do NOT smoke after Midnight the night before surgery.  STOP TAKING all Vitamins, Herbs and supplements 1 week before your surgery.   Take ONLY these medicines the morning of surgery with A SIP OF WATER: Tylenol  if needed for pain.   DO NOT TAKE Triamterene  / HCTZ the morning of your surgery                   You may not have any metal on your body including hair pins, jewelry, and body piercing  Do not wear make-up, lotions, powders, perfumes  or deodorant  Do not wear nail polish including gel and S&S, artificial / acrylic nails, or any other type of covering on natural nails including finger and toenails. If you have artificial nails, gel coating, etc., that needs to be removed by a nail salon, Please have this removed prior to surgery. Not doing so may mean that your surgery could be cancelled or delayed if the Surgeon or anesthesia staff feels like they are unable to monitor you safely.   Do not shave 48 hours prior to surgery to avoid nicks in your skin which may contribute to postoperative infections.   Contacts, Hearing Aids, dentures or bridgework may not be  worn into surgery. DENTURES WILL BE REMOVED PRIOR TO SURGERY PLEASE DO NOT APPLY Poly grip OR ADHESIVES!!!  You may bring a small overnight bag with you on the day of surgery, only pack items that are not valuable. [facility] IS NOT RESPONSIBLE   FOR VALUABLES THAT ARE LOST OR STOLEN.   Do not bring your home medications to the hospital. The Pharmacy will dispense medications listed on your medication list to you during your admission in the Hospital.  Special Instructions: Bring a copy of your healthcare power of attorney and living will documents the day of surgery, if you wish to have them scanned into your [facility] Records- EPIC  Please read over the following fact sheets you were given: IF YOU HAVE QUESTIONS ABOUT YOUR PRE-OP INSTRUCTIONS, PLEASE CALL [phone].      Pre-operative 4 CHG Bath Instructions   You can play a key role in reducing the risk of infection after surgery. Your skin needs to be as free of germs as possible. You can reduce the number of germs on your skin by washing with CHG (chlorhexidine  gluconate) soap before surgery. CHG is an antiseptic soap that kills germs and continues to kill germs even after washing.   DO NOT use if you have an allergy to chlorhexidine /CHG or antibacterial soaps. If your skin becomes reddened or irritated, stop using the CHG and notify one of our RNs at   Please shower with the CHG soap starting 4 days before surgery using the following schedule: MONDAY  December 29, 2023    Please keep in mind the following:  DO NOT shave, including legs and underarms, starting the day of your first shower.   You may shave your face at any point before/day of surgery.  Place clean sheets on your bed the day you start using CHG soap. Use a clean washcloth (not used since being washed) for each shower. DO  NOT sleep with pets once you start using the CHG.  CHG Shower Instructions:  If you choose to wash your hair and private area, wash  first with your normal shampoo/soap.  After you use shampoo/soap, rinse your hair and body thoroughly to remove shampoo/soap residue.  Turn the water OFF and apply about 3 tablespoons (45 ml) of CHG soap to a CLEAN washcloth.  Apply CHG soap ONLY FROM YOUR NECK DOWN TO YOUR TOES (washing for 3-5 minutes)  DO NOT use CHG soap on face, private areas, open wounds, or sores.  Pay special attention to the area where your surgery is being performed.  If you are having back surgery, having someone wash your back for you may be helpful. Wait 2 minutes after CHG soap is applied, then you may rinse off the CHG soap.  Pat dry with a clean towel  Put on clean clothes/pajamas   If you choose to wear lotion, please use ONLY the CHG-compatible lotions on the back of this paper.     Additional instructions for the day of surgery: DO NOT APPLY any CHG Soap,  lotions, deodorants, cologne, or perfumes on the day of surgery  Put on clean/comfortable clothes.  Brush your teeth.  Ask your nurse before applying any prescription medications to the skin.   CHG Compatible Lotions   Aveeno Moisturizing lotion  Cetaphil Moisturizing Cream  Cetaphil Moisturizing Lotion  Clairol Herbal Essence Moisturizing Lotion, Dry Skin  Clairol Herbal Essence Moisturizing Lotion, Extra Dry Skin  Clairol Herbal Essence Moisturizing Lotion, Normal Skin  Curel Age Defying Therapeutic Moisturizing Lotion with Alpha Hydroxy  Curel Extreme Care Body Lotion  Curel Soothing Hands Moisturizing Hand Lotion  Curel Therapeutic Moisturizing Cream, Fragrance-Free  Curel Therapeutic Moisturizing Lotion, Fragrance-Free  Curel Therapeutic Moisturizing Lotion, Original Formula  Eucerin Daily Replenishing Lotion  Eucerin Dry Skin Therapy Plus Alpha Hydroxy Crme  Eucerin Dry Skin Therapy Plus Alpha Hydroxy Lotion  Eucerin Original Crme  Eucerin Original Lotion  Eucerin Plus Crme Eucerin Plus Lotion  Eucerin TriLipid Replenishing  Lotion  Keri Anti-Bacterial Hand Lotion  Keri Deep Conditioning Original Lotion Dry Skin Formula Softly Scented  Keri Deep Conditioning Original Lotion, Fragrance Free Sensitive Skin Formula  Keri Lotion Fast Absorbing Fragrance Free Sensitive Skin Formula  Keri Lotion Fast Absorbing Softly Scented Dry Skin Formula  Keri Original Lotion  Keri Skin Renewal Lotion Keri Silky Smooth Lotion  Keri Silky Smooth Sensitive Skin Lotion  Nivea Body Creamy Conditioning Oil  Nivea Body Extra Enriched Lotion  Nivea Body Original Lotion  Nivea Body Sheer Moisturizing Lotion Nivea Crme  Nivea Skin Firming Lotion  NutraDerm 30 Skin Lotion  NutraDerm Skin Lotion  NutraDerm Therapeutic Skin Cream  NutraDerm Therapeutic Skin Lotion  ProShield Protective Hand Cream  Provon moisturizing lotion   FAILURE TO FOLLOW THESE INSTRUCTIONS MAY RESULT IN THE CANCELLATION OF YOUR SURGERY  PATIENT SIGNATURE_________________________________  NURSE SIGNATURE__________________________________  ________________________________________________________________________       Jenna Jordan    An incentive spirometer is a tool that can help keep your lungs clear and active. This tool measures how well you are filling your lungs with each breath. Taking long deep breaths may help reverse or decrease the chance of developing breathing (pulmonary) problems (especially infection) following: A long period of time when you are unable to move or be active. BEFORE THE PROCEDURE  If the spirometer includes an indicator to show your best effort, your nurse or respiratory therapist will set it to a desired goal. If  possible, sit up straight or lean slightly forward. Try not to slouch. Hold the incentive spirometer in an upright position. INSTRUCTIONS FOR USE  Sit on the edge of your bed if possible, or sit up as far as you can in bed or on a chair. Hold the incentive spirometer in an upright position. Breathe out  normally. Place the mouthpiece in your mouth and seal your lips tightly around it. Breathe in slowly and as deeply as possible, raising the piston or the ball toward the top of the column. Hold your breath for 3-5 seconds or for as long as possible. Allow the piston or ball to fall to the bottom of the column. Remove the mouthpiece from your mouth and breathe out normally. Rest for a few seconds and repeat Steps 1 through 7 at least 10 times every 1-2 hours when you are awake. Take your time and take a few normal breaths between deep breaths. The spirometer may include an indicator to show your best effort. Use the indicator as a goal to work toward during each repetition. After each set of 10 deep breaths, practice coughing to be sure your lungs are clear. If you have an incision (the cut made at the time of surgery), support your incision when coughing by placing a pillow or rolled up towels firmly against it. Once you are able to get out of bed, walk around indoors and cough well. You may stop using the incentive spirometer when instructed by your caregiver.  RISKS AND COMPLICATIONS Take your time so you do not get dizzy or light-headed. If you are in pain, you may need to take or ask for pain medication before doing incentive spirometry. It is harder to take a deep breath if you are having pain. AFTER USE Rest and breathe slowly and easily. It can be helpful to keep track of a log of your progress. Your caregiver can provide you with a simple table to help with this. If you are using the spirometer at home, follow these instructions: SEEK MEDICAL CARE IF:  You are having difficultly using the spirometer. You have trouble using the spirometer as often as instructed. Your pain medication is not giving enough relief while using the spirometer. You develop fever of 100.5 F (38.1 C) or higher.                                                                                                     SEEK IMMEDIATE MEDICAL CARE IF:  You cough up bloody sputum that had not been present before. You develop fever of 102 F (38.9 C) or greater. You develop worsening pain at or near the incision site. MAKE SURE YOU:  Understand these instructions. Will watch your condition. Will get help right away if you are not doing well or get worse. Document Released: 06/24/2006 Document Revised: 05/06/2011 Document Reviewed: 08/25/2006 [facility] Patient Information 2014 [place], [place].     If you would like to see a video about joint replacement:   [email or web address]

## 2023-12-23 ENCOUNTER — Encounter (HOSPITAL_COMMUNITY)
Admission: RE | Admit: 2023-12-23 | Discharge: 2023-12-23 | Disposition: A | Discharge status: Home / Self Care | Source: Ambulatory Visit | Attending: Orthopedic Surgery | Admitting: Orthopedic Surgery

## 2023-12-23 ENCOUNTER — Other Ambulatory Visit: Payer: Self-pay

## 2023-12-23 ENCOUNTER — Encounter (HOSPITAL_COMMUNITY): Payer: Self-pay

## 2023-12-23 VITALS — BP 160/98 | HR 86 | Temp 98.6°F | Resp 22 | Ht 65.0 in | Wt 222.0 lb

## 2023-12-23 DIAGNOSIS — M1712 Unilateral primary osteoarthritis, left knee: Secondary | ICD-10-CM | POA: Insufficient documentation

## 2023-12-23 DIAGNOSIS — I4510 Unspecified right bundle-branch block: Secondary | ICD-10-CM | POA: Insufficient documentation

## 2023-12-23 DIAGNOSIS — I10 Essential (primary) hypertension: Secondary | ICD-10-CM | POA: Insufficient documentation

## 2023-12-23 DIAGNOSIS — Z01818 Encounter for other preprocedural examination: Secondary | ICD-10-CM | POA: Insufficient documentation

## 2023-12-23 HISTORY — DX: Myoneural disorder, unspecified: G70.9

## 2023-12-23 HISTORY — DX: Prediabetes: R73.03

## 2023-12-23 HISTORY — DX: Chronic kidney disease, unspecified: N18.9

## 2023-12-23 HISTORY — DX: Unspecified osteoarthritis, unspecified site: M19.90

## 2023-12-23 HISTORY — DX: Malignant neoplasm of parotid gland: C07

## 2023-12-23 LAB — BASIC METABOLIC PANEL WITH GFR
Anion gap: 11 (ref 5–15)
BUN: 20 mg/dL (ref 8–23)
CO2: 25 mmol/L (ref 22–32)
Calcium: 10.6 mg/dL — ABNORMAL HIGH (ref 8.9–10.3)
Chloride: 102 mmol/L (ref 98–111)
Creatinine, Ser: 0.87 mg/dL (ref 0.44–1.00)
GFR, Estimated: >60 mL/min (ref >60)
Glucose, Bld: 104 mg/dL — ABNORMAL HIGH (ref 70–99)
Potassium: 3.9 mmol/L (ref 3.5–5.1)
Sodium: 138 mmol/L (ref 135–145)

## 2023-12-23 LAB — CBC
HCT: 45.6 % (ref 36.0–46.0)
Hemoglobin: 14.7 g/dL (ref 12.0–15.0)
MCH: 30.4 pg (ref 26.0–34.0)
MCHC: 32.2 g/dL (ref 30.0–36.0)
MCV: 94.2 fL (ref 80.0–100.0)
Platelets: 267 K/uL (ref 150–400)
RBC: 4.84 MIL/uL (ref 3.87–5.11)
RDW: 12.5 % (ref 11.5–15.5)
WBC: 8 K/uL (ref 4.0–10.5)
nRBC: 0 % (ref 0.0–0.2)

## 2023-12-23 LAB — SURGICAL PCR SCREEN
MRSA, PCR: NEGATIVE
Staphylococcus aureus: NEGATIVE

## 2023-12-24 ENCOUNTER — Encounter (HOSPITAL_COMMUNITY): Payer: Self-pay

## 2023-12-24 NOTE — Anesthesia Preprocedure Evaluation (Addendum)
 Anesthesia Evaluation  Patient identified by MRN, date of birth, ID band Patient awake    Reviewed: Allergy & Precautions, NPO status , Patient's Chart, lab work & pertinent test results  History of Anesthesia Complications (+) PONV and history of anesthetic complications  Airway Mallampati: III  TM Distance: >3 FB Neck ROM: Full    Dental  (+) Teeth Intact, Dental Advisory Given   Pulmonary neg pulmonary ROS   Pulmonary exam normal breath sounds clear to auscultation       Cardiovascular hypertension (177/89 preop, poorly controlled HTN), Pt. on medications + CAD (nonobstructive- elevated Ca score 2024)  Normal cardiovascular exam Rhythm:Regular Rate:Normal  Cardiac ct 2024 1. Coronary calcium  score of 259. This was 12 percentile for age-, race-, and sex-matched controls.   2.  Ascending Aorta: Mildly dilated, 40 mm.    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), breast cancer s/p left mastectomy, right lumpectomy  Reproductive/Obstetrics negative OB ROS                              Anesthesia Physical Anesthesia Plan  ASA: 3  Anesthesia Plan: Spinal, MAC and Regional   Post-op Pain Management: Regional block* and Tylenol  PO (pre-op)*   Induction:   PONV Risk Score and Plan: 2 and Propofol  infusion and TIVA  Airway Management Planned: Natural Airway and Nasal Cannula  Additional Equipment: None  Intra-op Plan:   Post-operative Plan:   Informed Consent: I  have reviewed the patients History and Physical, chart, labs and discussed the procedure including the risks, benefits and alternatives for the proposed anesthesia with the patient or authorized representative who has indicated his/her understanding and acceptance.       Plan Discussed with: CRNA  Anesthesia Plan Comments:          Anesthesia Quick Evaluation

## 2024-01-02 ENCOUNTER — Encounter (HOSPITAL_COMMUNITY): Payer: Self-pay | Admitting: Orthopedic Surgery

## 2024-01-02 ENCOUNTER — Other Ambulatory Visit: Payer: Self-pay

## 2024-01-02 ENCOUNTER — Ambulatory Visit (HOSPITAL_COMMUNITY): Payer: Self-pay | Admitting: Anesthesiology

## 2024-01-02 ENCOUNTER — Observation Stay (HOSPITAL_COMMUNITY)
Admission: RE | Admit: 2024-01-02 | Discharge: 2024-01-03 | Disposition: A | Discharge status: Home / Self Care | Payer: PRIVATE HEALTH INSURANCE | Source: Ambulatory Visit | Attending: Orthopedic Surgery | Admitting: Orthopedic Surgery

## 2024-01-02 ENCOUNTER — Encounter (HOSPITAL_COMMUNITY)
Admission: RE | Disposition: A | Discharge status: Home / Self Care | Payer: Self-pay | Source: Ambulatory Visit | Attending: Orthopedic Surgery

## 2024-01-02 ENCOUNTER — Ambulatory Visit (HOSPITAL_COMMUNITY): Payer: Self-pay | Admitting: Medical

## 2024-01-02 DIAGNOSIS — Z7982 Long term (current) use of aspirin: Secondary | ICD-10-CM | POA: Insufficient documentation

## 2024-01-02 DIAGNOSIS — N189 Chronic kidney disease, unspecified: Secondary | ICD-10-CM | POA: Insufficient documentation

## 2024-01-02 DIAGNOSIS — Z96652 Presence of left artificial knee joint: Principal | ICD-10-CM

## 2024-01-02 DIAGNOSIS — Z79899 Other long term (current) drug therapy: Secondary | ICD-10-CM | POA: Diagnosis not present

## 2024-01-02 DIAGNOSIS — I10 Essential (primary) hypertension: Secondary | ICD-10-CM | POA: Diagnosis not present

## 2024-01-02 DIAGNOSIS — I2510 Atherosclerotic heart disease of native coronary artery without angina pectoris: Secondary | ICD-10-CM

## 2024-01-02 DIAGNOSIS — M1712 Unilateral primary osteoarthritis, left knee: Secondary | ICD-10-CM | POA: Diagnosis not present

## 2024-01-02 DIAGNOSIS — E1122 Type 2 diabetes mellitus with diabetic chronic kidney disease: Secondary | ICD-10-CM | POA: Diagnosis not present

## 2024-01-02 DIAGNOSIS — Z85828 Personal history of other malignant neoplasm of skin: Secondary | ICD-10-CM | POA: Diagnosis not present

## 2024-01-02 DIAGNOSIS — M25562 Pain in left knee: Secondary | ICD-10-CM | POA: Diagnosis present

## 2024-01-02 DIAGNOSIS — I129 Hypertensive chronic kidney disease with stage 1 through stage 4 chronic kidney disease, or unspecified chronic kidney disease: Secondary | ICD-10-CM | POA: Diagnosis not present

## 2024-01-02 HISTORY — PX: TOTAL KNEE ARTHROPLASTY: SHX125

## 2024-01-02 SURGERY — ARTHROPLASTY, KNEE, TOTAL
Anesthesia: Monitor Anesthesia Care | Site: Knee | Laterality: Left

## 2024-01-02 MED ORDER — SODIUM CHLORIDE (PF) 0.9 % IJ SOLN
INTRAMUSCULAR | Status: DC | PRN
  Administered 2024-01-02: 80 mL via INTRAMUSCULAR

## 2024-01-02 MED ORDER — LIDOCAINE 2% (20 MG/ML) 5 ML SYRINGE
INTRAMUSCULAR | Status: DC | PRN
  Administered 2024-01-02: 30 mg via INTRAVENOUS

## 2024-01-02 MED ORDER — OMEGA-3-ACID ETHYL ESTERS 1 G PO CAPS
2.0000 g | ORAL_CAPSULE | Freq: Every day | ORAL | Status: DC
  Administered 2024-01-03: 2 g via ORAL
  Filled 2024-01-02: qty 2

## 2024-01-02 MED ORDER — METOCLOPRAMIDE HCL 5 MG PO TABS: 5.0000 mg | ORAL_TABLET | Freq: Three times a day (TID) | ORAL | Status: DC | PRN

## 2024-01-02 MED ORDER — ACETAMINOPHEN 500 MG PO TABS
500.0000 mg | ORAL_TABLET | Freq: Four times a day (QID) | ORAL | Status: DC | PRN
  Administered 2024-01-02 – 2024-01-03 (×2): 500 mg via ORAL
  Filled 2024-01-02 (×2): qty 1

## 2024-01-02 MED ORDER — SODIUM CHLORIDE (PF) 0.9 % IJ SOLN
INTRAMUSCULAR | Status: AC
  Filled 2024-01-02: qty 30

## 2024-01-02 MED ORDER — CEFAZOLIN SODIUM-DEXTROSE 2-4 GM/100ML-% IV SOLN
2.0000 g | Freq: Four times a day (QID) | INTRAVENOUS | Status: AC
  Administered 2024-01-02 (×2): 2 g via INTRAVENOUS
  Filled 2024-01-02 (×2): qty 100

## 2024-01-02 MED ORDER — FLORANEX PO PACK
1.0000 g | PACK | Freq: Every day | ORAL | Status: DC
  Filled 2024-01-02: qty 1

## 2024-01-02 MED ORDER — BUPIVACAINE LIPOSOME 1.3 % IJ SUSP
INTRAMUSCULAR | Status: AC
  Filled 2024-01-02: qty 20

## 2024-01-02 MED ORDER — MAGNESIUM OXIDE 400 MG PO TABS: 400.0000 mg | ORAL_TABLET | Freq: Every day | ORAL | Status: DC

## 2024-01-02 MED ORDER — PHENOL 1.4 % MT LIQD: 1.0000 | OROMUCOSAL | Status: DC | PRN

## 2024-01-02 MED ORDER — WATER FOR IRRIGATION, STERILE IR SOLN
Status: DC | PRN
  Administered 2024-01-02: 2000 mL

## 2024-01-02 MED ORDER — BACID PO TABS: 2.0000 | ORAL_TABLET | Freq: Every day | ORAL | Status: DC

## 2024-01-02 MED ORDER — OMEGA-3 FATTY ACIDS 1000 MG PO CAPS: 2.0000 g | ORAL_CAPSULE | Freq: Every day | ORAL | Status: DC

## 2024-01-02 MED ORDER — HYDROMORPHONE HCL 1 MG/ML IJ SOLN: 0.2500 mg | INTRAMUSCULAR | Status: DC | PRN

## 2024-01-02 MED ORDER — FENTANYL CITRATE (PF) 50 MCG/ML IJ SOSY: 25.0000 ug | PREFILLED_SYRINGE | Freq: Once | INTRAMUSCULAR | Status: AC

## 2024-01-02 MED ORDER — TRIAMTERENE-HCTZ 37.5-25 MG PO TABS
1.0000 | ORAL_TABLET | Freq: Every day | ORAL | Status: DC
  Administered 2024-01-02: 1 via ORAL
  Filled 2024-01-02 (×2): qty 1

## 2024-01-02 MED ORDER — CEFAZOLIN SODIUM-DEXTROSE 2-4 GM/100ML-% IV SOLN
2.0000 g | INTRAVENOUS | Status: AC
  Administered 2024-01-02: 2 g via INTRAVENOUS
  Filled 2024-01-02: qty 100

## 2024-01-02 MED ORDER — ACETAMINOPHEN 325 MG PO TABS: 325.0000 mg | ORAL_TABLET | Freq: Four times a day (QID) | ORAL | Status: DC | PRN

## 2024-01-02 MED ORDER — DEXAMETHASONE SOD PHOSPHATE PF 10 MG/ML IJ SOLN
INTRAMUSCULAR | Status: DC | PRN
  Administered 2024-01-02: 10 mg via PERINEURAL

## 2024-01-02 MED ORDER — HYDROCODONE-ACETAMINOPHEN 7.5-325 MG PO TABS: 1.0000 | ORAL_TABLET | Freq: Once | ORAL | Status: DC | PRN

## 2024-01-02 MED ORDER — MENTHOL 3 MG MT LOZG: 1.0000 | LOZENGE | OROMUCOSAL | Status: DC | PRN

## 2024-01-02 MED ORDER — B COMPLEX PO TABS: 1.0000 | ORAL_TABLET | Freq: Every day | ORAL | Status: DC

## 2024-01-02 MED ORDER — TRANEXAMIC ACID-NACL 1000-0.7 MG/100ML-% IV SOLN
1000.0000 mg | INTRAVENOUS | Status: AC
  Administered 2024-01-02: 1000 mg via INTRAVENOUS
  Filled 2024-01-02: qty 100

## 2024-01-02 MED ORDER — B COMPLEX-C PO TABS
1.0000 | ORAL_TABLET | Freq: Every day | ORAL | Status: DC
  Administered 2024-01-03: 1 via ORAL
  Filled 2024-01-02: qty 1

## 2024-01-02 MED ORDER — PROPOFOL 500 MG/50ML IV EMUL
INTRAVENOUS | Status: DC | PRN
  Administered 2024-01-02: 85 ug/kg/min via INTRAVENOUS

## 2024-01-02 MED ORDER — VITAMIN D3 25 MCG (1000 UT) PO CAPS: 1.0000 | ORAL_CAPSULE | Freq: Every day | ORAL | Status: DC

## 2024-01-02 MED ORDER — 0.9 % SODIUM CHLORIDE (POUR BTL) OPTIME
TOPICAL | Status: DC | PRN
  Administered 2024-01-02 (×2): 1000 mL

## 2024-01-02 MED ORDER — METHOCARBAMOL 500 MG PO TABS: 500.0000 mg | ORAL_TABLET | Freq: Three times a day (TID) | ORAL | 1 refills | Status: AC | PRN

## 2024-01-02 MED ORDER — ONDANSETRON HCL 4 MG PO TABS: 4.0000 mg | ORAL_TABLET | Freq: Three times a day (TID) | ORAL | 1 refills | Status: AC | PRN

## 2024-01-02 MED ORDER — DOCUSATE SODIUM 100 MG PO CAPS
100.0000 mg | ORAL_CAPSULE | Freq: Two times a day (BID) | ORAL | Status: DC
  Administered 2024-01-02 – 2024-01-03 (×2): 100 mg via ORAL
  Filled 2024-01-02 (×2): qty 1

## 2024-01-02 MED ORDER — ONDANSETRON HCL 4 MG/2ML IJ SOLN: 4.0000 mg | Freq: Once | INTRAMUSCULAR | Status: DC | PRN

## 2024-01-02 MED ORDER — ONDANSETRON HCL 4 MG/2ML IJ SOLN
INTRAMUSCULAR | Status: AC
  Filled 2024-01-02: qty 2

## 2024-01-02 MED ORDER — SODIUM CHLORIDE 0.9 % IV SOLN: INTRAVENOUS | Status: DC

## 2024-01-02 MED ORDER — METOCLOPRAMIDE HCL 5 MG/ML IJ SOLN: 5.0000 mg | Freq: Three times a day (TID) | INTRAMUSCULAR | Status: DC | PRN

## 2024-01-02 MED ORDER — BUPIVACAINE-EPINEPHRINE (PF) 0.25% -1:200000 IJ SOLN
INTRAMUSCULAR | Status: AC
  Filled 2024-01-02: qty 30

## 2024-01-02 MED ORDER — OXYCODONE HCL 5 MG PO TABS
5.0000 mg | ORAL_TABLET | ORAL | Status: DC | PRN
  Administered 2024-01-02 – 2024-01-03 (×3): 5 mg via ORAL
  Filled 2024-01-02 (×3): qty 1

## 2024-01-02 MED ORDER — FENTANYL CITRATE (PF) 50 MCG/ML IJ SOSY
PREFILLED_SYRINGE | INTRAMUSCULAR | Status: AC
  Administered 2024-01-02: 50 ug via INTRAVENOUS
  Filled 2024-01-02: qty 2

## 2024-01-02 MED ORDER — OXYCODONE HCL 5 MG PO TABS: 5.0000 mg | ORAL_TABLET | Freq: Three times a day (TID) | ORAL | 0 refills | Status: AC | PRN

## 2024-01-02 MED ORDER — POVIDONE-IODINE 10 % EX SWAB: 2.0000 | Freq: Once | CUTANEOUS | Status: DC

## 2024-01-02 MED ORDER — TRANEXAMIC ACID-NACL 1000-0.7 MG/100ML-% IV SOLN
1000.0000 mg | Freq: Once | INTRAVENOUS | Status: AC
  Administered 2024-01-02: 1000 mg via INTRAVENOUS
  Filled 2024-01-02: qty 100

## 2024-01-02 MED ORDER — METHOCARBAMOL 1000 MG/10ML IJ SOLN
500.0000 mg | Freq: Four times a day (QID) | INTRAMUSCULAR | Status: DC | PRN
Start: 2024-01-02 — End: 2024-01-03

## 2024-01-02 MED ORDER — ASPIRIN 81 MG PO TBEC: 81.0000 mg | DELAYED_RELEASE_TABLET | Freq: Two times a day (BID) | ORAL | 0 refills | Status: AC

## 2024-01-02 MED ORDER — SODIUM CHLORIDE 0.9 % IR SOLN
Status: DC | PRN
  Administered 2024-01-02: 1000 mL

## 2024-01-02 MED ORDER — ONDANSETRON HCL 4 MG/2ML IJ SOLN
INTRAMUSCULAR | Status: DC | PRN
  Administered 2024-01-02: 4 mg via INTRAVENOUS

## 2024-01-02 MED ORDER — METHOCARBAMOL 500 MG PO TABS
500.0000 mg | ORAL_TABLET | Freq: Four times a day (QID) | ORAL | Status: DC | PRN
Start: 2024-01-02 — End: 2024-01-03
  Administered 2024-01-02 – 2024-01-03 (×3): 500 mg via ORAL
  Filled 2024-01-02 (×3): qty 1

## 2024-01-02 MED ORDER — VITAMIN C 500 MG PO TABS
1000.0000 mg | ORAL_TABLET | Freq: Every day | ORAL | Status: DC
  Administered 2024-01-03: 1000 mg via ORAL
  Filled 2024-01-02: qty 2

## 2024-01-02 MED ORDER — ACETAMINOPHEN 500 MG PO TABS
1000.0000 mg | ORAL_TABLET | Freq: Once | ORAL | Status: AC
  Administered 2024-01-02: 1000 mg via ORAL
  Filled 2024-01-02: qty 2

## 2024-01-02 MED ORDER — DEXAMETHASONE SOD PHOSPHATE PF 10 MG/ML IJ SOLN
INTRAMUSCULAR | Status: DC | PRN
  Administered 2024-01-02: 10 mg via INTRAVENOUS

## 2024-01-02 MED ORDER — PROPOFOL 10 MG/ML IV BOLUS
INTRAVENOUS | Status: DC | PRN
  Administered 2024-01-02: 30 mg via INTRAVENOUS

## 2024-01-02 MED ORDER — VITAMIN D 25 MCG (1000 UNIT) PO TABS
1000.0000 [IU] | ORAL_TABLET | Freq: Every day | ORAL | Status: DC
  Administered 2024-01-03: 1000 [IU] via ORAL
  Filled 2024-01-02: qty 1

## 2024-01-02 MED ORDER — MAGNESIUM OXIDE -MG SUPPLEMENT 400 (240 MG) MG PO TABS
400.0000 mg | ORAL_TABLET | Freq: Every day | ORAL | Status: DC
  Administered 2024-01-03: 400 mg via ORAL
  Filled 2024-01-02: qty 1

## 2024-01-02 MED ORDER — HYDROMORPHONE HCL 1 MG/ML IJ SOLN: 0.5000 mg | INTRAMUSCULAR | Status: DC | PRN

## 2024-01-02 MED ORDER — LACTATED RINGERS IV SOLN: INTRAVENOUS | Status: DC

## 2024-01-02 MED ORDER — ONDANSETRON HCL 4 MG PO TABS: 4.0000 mg | ORAL_TABLET | Freq: Four times a day (QID) | ORAL | Status: DC | PRN

## 2024-01-02 MED ORDER — ORAL CARE MOUTH RINSE: 15.0000 mL | Freq: Once | OROMUCOSAL | Status: AC

## 2024-01-02 MED ORDER — DOCUSATE SODIUM 100 MG PO CAPS: 100.0000 mg | ORAL_CAPSULE | Freq: Every day | ORAL | Status: DC

## 2024-01-02 MED ORDER — BUPIVACAINE IN DEXTROSE 0.75-8.25 % IT SOLN
INTRATHECAL | Status: DC | PRN
Start: 2024-01-02 — End: 2024-01-02
  Administered 2024-01-02: 1.8 mL via INTRATHECAL

## 2024-01-02 MED ORDER — AMISULPRIDE (ANTIEMETIC) 5 MG/2ML IV SOLN: 10.0000 mg | Freq: Once | INTRAVENOUS | Status: DC | PRN

## 2024-01-02 MED ORDER — BUPIVACAINE LIPOSOME 1.3 % IJ SUSP: 20.0000 mL | Freq: Once | INTRAMUSCULAR | Status: DC

## 2024-01-02 MED ORDER — ONDANSETRON HCL 4 MG/2ML IJ SOLN: 4.0000 mg | Freq: Four times a day (QID) | INTRAMUSCULAR | Status: DC | PRN

## 2024-01-02 MED ORDER — ASPIRIN 81 MG PO CHEW
81.0000 mg | CHEWABLE_TABLET | Freq: Two times a day (BID) | ORAL | Status: DC
  Administered 2024-01-02 – 2024-01-03 (×2): 81 mg via ORAL
  Filled 2024-01-02 (×2): qty 1

## 2024-01-02 MED ORDER — ROPIVACAINE HCL 5 MG/ML IJ SOLN
INTRAMUSCULAR | Status: DC | PRN
  Administered 2024-01-02: 30 mL via PERINEURAL

## 2024-01-02 MED ORDER — CHLORHEXIDINE GLUCONATE 0.12 % MT SOLN
15.0000 mL | Freq: Once | OROMUCOSAL | Status: AC
  Administered 2024-01-02: 15 mL via OROMUCOSAL

## 2024-01-02 SURGICAL SUPPLY — 44 items
ATTUNE MED DOME PAT 38 KNEE (Knees) IMPLANT
ATTUNE PSFEM LTSZ6 NARCEM KNEE (Femur) IMPLANT
ATTUNE PSRP INSR SZ6 12 KNEE (Insert) IMPLANT
BAG COUNTER SPONGE SURGICOUNT (BAG) IMPLANT
BAG ZIPLOCK 12X15 (MISCELLANEOUS) ×1 IMPLANT
BASE TIBIA ATTUNE KNEE SYS SZ6 (Knees) IMPLANT
BLADE SAG 18X100X1.27 (BLADE) ×1 IMPLANT
BLADE SAW SGTL 13X75X1.27 (BLADE) ×1 IMPLANT
BNDG ELASTIC 6X10 VLCR STRL LF (GAUZE/BANDAGES/DRESSINGS) ×1 IMPLANT
BNDG GAUZE DERMACEA FLUFF 4 (GAUZE/BANDAGES/DRESSINGS) ×1 IMPLANT
BOWL SMART MIX CTS (DISPOSABLE) ×1 IMPLANT
CEMENT HV SMART SET (Cement) ×2 IMPLANT
COVER SURGICAL LIGHT HANDLE (MISCELLANEOUS) ×1 IMPLANT
CUFF TRNQT CYL 34X4.125X (TOURNIQUET CUFF) ×1 IMPLANT
DRAPE SHEET LG 3/4 BI-LAMINATE (DRAPES) ×1 IMPLANT
DRAPE U-SHAPE 47X51 STRL (DRAPES) ×1 IMPLANT
DRSG ADAPTIC 3X8 NADH LF (GAUZE/BANDAGES/DRESSINGS) ×1 IMPLANT
DURAPREP 26ML APPLICATOR (WOUND CARE) ×1 IMPLANT
ELECT PENCIL ROCKER SW 15FT (MISCELLANEOUS) ×1 IMPLANT
ELECT REM PT RETURN 15FT ADLT (MISCELLANEOUS) ×1 IMPLANT
GAUZE PAD ABD 8X10 STRL (GAUZE/BANDAGES/DRESSINGS) ×1 IMPLANT
GAUZE SPONGE 4X4 12PLY STRL (GAUZE/BANDAGES/DRESSINGS) ×1 IMPLANT
GLOVE BIOGEL PI IND STRL 7.5 (GLOVE) ×1 IMPLANT
GLOVE BIOGEL PI IND STRL 8.5 (GLOVE) ×1 IMPLANT
GLOVE ORTHO TXT STRL SZ7.5 (GLOVE) ×1 IMPLANT
GLOVE SURG ORTHO 8.5 STRL (GLOVE) ×1 IMPLANT
GOWN STRL REUS W/ TWL XL LVL3 (GOWN DISPOSABLE) ×2 IMPLANT
IMMOBILIZER KNEE 20 THIGH 36 (SOFTGOODS) ×1 IMPLANT
KIT TURNOVER KIT A (KITS) ×1 IMPLANT
MANIFOLD NEPTUNE II (INSTRUMENTS) ×1 IMPLANT
NS IRRIG 1000ML POUR BTL (IV SOLUTION) ×1 IMPLANT
PACK TOTAL KNEE CUSTOM (KITS) ×1 IMPLANT
PIN STEINMAN FIXATION KNEE (PIN) IMPLANT
PROTECTOR NERVE ULNAR (MISCELLANEOUS) ×1 IMPLANT
SET HNDPC FAN SPRY TIP SCT (DISPOSABLE) ×1 IMPLANT
STRIP CLOSURE SKIN 1/2X4 (GAUZE/BANDAGES/DRESSINGS) ×2 IMPLANT
SUT MNCRL AB 3-0 PS2 18 (SUTURE) ×1 IMPLANT
SUT VIC AB 0 CT1 36 (SUTURE) ×1 IMPLANT
SUT VIC AB 1 CT1 36 (SUTURE) ×2 IMPLANT
SUT VIC AB 2-0 CT1 TAPERPNT 27 (SUTURE) ×1 IMPLANT
TOWEL GREEN STERILE FF (TOWEL DISPOSABLE) ×1 IMPLANT
TRAY CATH INTERMITTENT SS 16FR (CATHETERS) ×1 IMPLANT
WATER STERILE IRR 1000ML POUR (IV SOLUTION) ×2 IMPLANT
YANKAUER SUCT BULB TIP NO VENT (SUCTIONS) ×1 IMPLANT
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
## 2024-01-02 NOTE — Transfer of Care (Signed)
 Immediate Anesthesia Transfer of Care Note  Patient: Jenna Jordan  Procedure(s) Performed: ARTHROPLASTY, KNEE, TOTAL (Left: Knee)  Patient Location: PACU  Anesthesia Type:Spinal  Level of Consciousness: awake, alert , and oriented  Airway & Oxygen Therapy: Patient Spontanous Breathing  Post-op Assessment: Report given to RN and Post -op Vital signs reviewed and stable  Post vital signs: Reviewed and stable  Last Vitals:  Vitals Value Taken Time  BP 131/67 01/02/24 13:15  Temp    Pulse 86 01/02/24 13:15  Resp 19 01/02/24 13:15  SpO2 95 % 01/02/24 13:15  Vitals shown include unfiled device data.  Last Pain:  Vitals:   01/02/24 0954  TempSrc:   PainSc: 0-No pain      Patients Stated Pain Goal: 4 (01/02/24 0900)  Complications: No notable events documented.

## 2024-01-02 NOTE — Progress Notes (Signed)
 Orthopedic Tech Progress Note Patient Details:  Jenna Jordan [birth year] [id number] CPM will be removed at 1745 CPM Left Knee CPM Left Knee: On Left Knee Flexion (Degrees): 90 Left Knee Extension (Degrees): 0  Post Interventions Patient Tolerated: Well Ortho Devices Type of Ortho Device: Bone foam zero knee Ortho Device/Splint Location: Left knee Ortho Device/Splint Interventions: Application   Post Interventions Patient Tolerated: Well  Jenna Jordan 01/02/2024, 1:51 PM

## 2024-01-02 NOTE — Discharge Instructions (Signed)
 Ice to the knee constantly.  Keep the incision covered and clean and dry for one week with the Aquacel bandage. Ok to shower with the Aquacel(gel) bandage on. After one week, ok to remove the bandage and leave the incision open to air.  Do exercise as instructed every hour, please to prevent stiffness.    DO NOT prop anything under the knee, it will make your knee stiff.  Prop under the ankle to encourage your knee to go straight.   Use the walker while you are up and around for balance.  Wear your support stockings 24/7 to prevent blood clots and take baby aspirin twice daily for 30 days also to prevent blood clots  Follow up with Dr Kay in two weeks in the office, call [phone] for appt  Please call Dr Kay (cell) [phone] with any questions or concerns    INSTRUCTIONS AFTER JOINT REPLACEMENT   Remove items at home which could result in a fall. This includes throw rugs or furniture in walking pathways ICE to the affected joint every three hours while awake for 30 minutes at a time, for at least the first 3-5 days, and then as needed for pain and swelling.  Continue to use ice for pain and swelling. You may notice swelling that will progress down to the foot and ankle.  This is normal after surgery.  Elevate your leg when you are not up walking on it.   Continue to use the breathing machine you got in the hospital (incentive spirometer) which will help keep your temperature down.  It is common for your temperature to cycle up and down following surgery, especially at night when you are not up moving around and exerting yourself.  The breathing machine keeps your lungs expanded and your temperature down.   DIET:  As you were doing prior to hospitalization, we recommend a well-balanced diet.  DRESSING / WOUND CARE / SHOWERING  Keep the Aquacel bandage on for one week. Ok to shower as the bandage is waterproof  ACTIVITY  Increase activity slowly as tolerated, but follow the  weight bearing instructions below.   No driving for 6 weeks or until further direction given by your physician.  You cannot drive while taking narcotics.  No lifting or carrying greater than 10 lbs. until further directed by your surgeon. Avoid periods of inactivity such as sitting longer than an hour when not asleep. This helps prevent blood clots.  You may return to work once you are authorized by your doctor.     WEIGHT BEARING   Weight bearing as tolerated with assist device (walker, cane, etc) as directed, use it as long as suggested by your surgeon or therapist, typically at least 4-6 weeks.   EXERCISES  Results after joint replacement surgery are often greatly improved when you follow the exercise, range of motion and muscle strengthening exercises prescribed by your doctor. Safety measures are also important to protect the joint from further injury. Any time any of these exercises cause you to have increased pain or swelling, decrease what you are doing until you are comfortable again and then slowly increase them. If you have problems or questions, call your caregiver or physical therapist for advice.   Rehabilitation is important following a joint replacement. After just a few days of immobilization, the muscles of the leg can become weakened and shrink (atrophy).  These exercises are designed to build up the tone and strength of the thigh and leg muscles  and to improve motion. Often times heat used for twenty to thirty minutes before working out will loosen up your tissues and help with improving the range of motion but do not use heat for the first two weeks following surgery (sometimes heat can increase post-operative swelling).   These exercises can be done on a training (exercise) mat, on the floor, on a table or on a bed. Use whatever works the best and is most comfortable for you.    Use music or television while you are exercising so that the exercises are a pleasant break in  your day. This will make your life better with the exercises acting as a break in your routine that you can look forward to.   Perform all exercises about fifteen times, three times per day or as directed.  You should exercise both the operative leg and the other leg as well.  Exercises include:   Quad Sets - Tighten up the muscle on the front of the thigh (Quad) and hold for 5-10 seconds.   Straight Leg Raises - With your knee straight (if you were given a brace, keep it on), lift the leg to 60 degrees, hold for 3 seconds, and slowly lower the leg.  Perform this exercise against resistance later as your leg gets stronger.  Leg Slides: Lying on your back, slowly slide your foot toward your buttocks, bending your knee up off the floor (only go as far as is comfortable). Then slowly slide your foot back down until your leg is flat on the floor again.  Angel Wings: Lying on your back spread your legs to the side as far apart as you can without causing discomfort.  Hamstring Strength:  Lying on your back, push your heel against the floor with your leg straight by tightening up the muscles of your buttocks.  Repeat, but this time bend your knee to a comfortable angle, and push your heel against the floor.  You may put a pillow under the heel to make it more comfortable if necessary.   A rehabilitation program following joint replacement surgery can speed recovery and prevent re-injury in the future due to weakened muscles. Contact your doctor or a physical therapist for more information on knee rehabilitation.    CONSTIPATION  Constipation is defined medically as fewer than three stools per week and severe constipation as less than one stool per week.  Even if you have a regular bowel pattern at home, your normal regimen is likely to be disrupted due to multiple reasons following surgery.  Combination of anesthesia, postoperative narcotics, change in appetite and fluid intake all can affect your bowels.    YOU MUST use at least one of the following options; they are listed in order of increasing strength to get the job done.  They are all available over the counter, and you may need to use some, POSSIBLY even all of these options:    Drink plenty of fluids (prune juice may be helpful) and high fiber foods Colace 100 mg by mouth twice a day  Senokot for constipation as directed and as needed Dulcolax (bisacodyl), take with full glass of water  Miralax (polyethylene glycol) once or twice a day as needed.  If you have tried all these things and are unable to have a bowel movement in the first 3-4 days after surgery call either your surgeon or your primary doctor.    If you experience loose stools or diarrhea, hold the medications until you stool  forms back up.  If your symptoms do not get better within 1 week or if they get worse, check with your doctor.  If you experience the worst abdominal pain ever or develop nausea or vomiting, please contact the office immediately for further recommendations for treatment.   ITCHING:  If you experience itching with your medications, try taking only a single pain pill, or even half a pain pill at a time.  You can also use Benadryl  over the counter for itching or also to help with sleep.   TED HOSE STOCKINGS:  Use stockings on both legs until for at least 2 weeks or as directed by physician office. They may be removed at night for sleeping.  MEDICATIONS:  See your medication summary on the "After Visit Summary" that nursing will review with you.  You may have some home medications which will be placed on hold until you complete the course of blood thinner medication.  It is important for you to complete the blood thinner medication as prescribed.  PRECAUTIONS:  If you experience chest pain or shortness of breath - call 911 immediately for transfer to the hospital emergency department.   If you develop a fever greater that 101 F, purulent drainage from wound,  increased redness or drainage from wound, foul odor from the wound/dressing, or calf pain - CONTACT YOUR SURGEON.                                                   FOLLOW-UP APPOINTMENTS:  If you do not already have a post-op appointment, please call the office for an appointment to be seen by your surgeon.  Guidelines for how soon to be seen are listed in your "After Visit Summary", but are typically between 1-4 weeks after surgery.  OTHER INSTRUCTIONS:   Knee Replacement:  Do not place pillow under knee, focus on keeping the knee straight while resting. CPM instructions: 0-90 degrees, 2 hours in the morning, 2 hours in the afternoon, and 2 hours in the evening. Place foam block, curve side up under heel at all times except when in CPM or when walking.  DO NOT modify, tear, cut, or change the foam block in any way.  POST-OPERATIVE OPIOID TAPER INSTRUCTIONS: It is important to wean off of your opioid medication as soon as possible. If you do not need pain medication after your surgery it is ok to stop day one. Opioids include: Codeine, Hydrocodone(Norco, Vicodin), Oxycodone(Percocet, oxycontin) and hydromorphone  amongst others.  Long term and even short term use of opiods can cause: Increased pain response Dependence Constipation Depression Respiratory depression And more.  Withdrawal symptoms can include Flu like symptoms Nausea, vomiting And more Techniques to manage these symptoms Hydrate well Eat regular healthy meals Stay active Use relaxation techniques(deep breathing, meditating, yoga) Do Not substitute Alcohol to help with tapering If you have been on opioids for less than two weeks and do not have pain than it is ok to stop all together.  Plan to wean off of opioids This plan should start within one week post op of your joint replacement. Maintain the same interval or time between taking each dose and first decrease the dose.  Cut the total daily intake of opioids by one  tablet each day Next start to increase the time between doses. The last dose that should be  eliminated is the evening dose.   MAKE SURE YOU:  Understand these instructions.  Get help right away if you are not doing well or get worse.    Thank you for letting us  be a part of your medical care team.  It is a privilege we respect greatly.  We hope these instructions will help you stay on track for a fast and full recovery!

## 2024-01-02 NOTE — Brief Op Note (Signed)
 01/02/2024  1:05 PM  PATIENT:  Mckinley DELENA Amber  83 y.o. female  PRE-OPERATIVE DIAGNOSIS:  Primary osteoarthritis of left knee  POST-OPERATIVE DIAGNOSIS:  Primary osteoarthritis of left knee  PROCEDURE:  Procedure(s): ARTHROPLASTY, KNEE, TOTAL (Left) DePuy Attune  SURGEON:  Surgeons and Role:    DEWAINE Kay Kemps, MD - Primary  PHYSICIAN ASSISTANT:   ASSISTANTS: Debby KATHEE Fireman, PA-C   ANESTHESIA:   regional and spinal  EBL:  150 mL   BLOOD ADMINISTERED:none  DRAINS: none   LOCAL MEDICATIONS USED:  MARCAINE     SPECIMEN:  No Specimen  DISPOSITION OF SPECIMEN:  N/A  COUNTS:  YES  TOURNIQUET:   Total Tourniquet Time Documented: Thigh (Left) - 89 minutes Total: Thigh (Left) - 89 minutes   DICTATION: .Other Dictation: Dictation Number [id number]  PLAN OF CARE: Admit for overnight observation  PATIENT DISPOSITION:  PACU - hemodynamically stable.   Delay start of Pharmacological VTE agent (>24hrs) due to surgical blood loss or risk of bleeding: not applicable

## 2024-01-02 NOTE — Anesthesia Postprocedure Evaluation (Signed)
 Anesthesia Post Note  Patient: Jenna Jordan  Procedure(s) Performed: ARTHROPLASTY, KNEE, TOTAL (Left: Knee)     Patient location during evaluation: PACU Anesthesia Type: Regional and Spinal Level of consciousness: awake and alert and oriented Pain management: pain level controlled Vital Signs Assessment: post-procedure vital signs reviewed and stable Respiratory status: spontaneous breathing, nonlabored ventilation and respiratory function stable Cardiovascular status: blood pressure returned to baseline and stable Postop Assessment: no headache, no backache, spinal receding and no apparent nausea or vomiting Anesthetic complications: no   No notable events documented.  Last Vitals:  Vitals:   01/02/24 1330 01/02/24 1345  BP: 139/73   Pulse: 80 88  Resp: 20   Temp:    SpO2: 98%     Last Pain:  Vitals:   01/02/24 1330  TempSrc:   PainSc: 0-No pain                 Jenna Jordan

## 2024-01-02 NOTE — Interval H&P Note (Signed)
 History and Physical Interval Note:  01/02/2024 9:08 AM  Jenna Jordan  has presented today for surgery, with the diagnosis of Primary osteoarthritis of left knee.  The various methods of treatment have been discussed with the patient and family. After consideration of risks, benefits and other options for treatment, the patient has consented to  Procedure(s): ARTHROPLASTY, KNEE, TOTAL (Left) as a surgical intervention.  The patient's history has been reviewed, patient examined, no change in status, stable for surgery.  I have reviewed the patient's chart and labs.  Questions were answered to the patient's satisfaction.     Elspeth JONELLE Her

## 2024-01-02 NOTE — Op Note (Signed)
 NAME: Jenna Jordan, TENCZA MEDICAL RECORD NO: [id number] ACCOUNT NO: [id number] DATE OF BIRTH: 06-25-1940 FACILITY: [facility]ATION: [id number] PHYSICIAN: Elspeth SAUNDERS. Kay, MD  Operative Report   DATE OF PROCEDURE: 01/02/2024  PREOPERATIVE DIAGNOSIS:  Left knee end-stage osteoarthritis.  POSTOPERATIVE DIAGNOSIS:  Left knee end-stage osteoarthritis.  PROCEDURE PERFORMED:  Left total knee arthroplasty using DePuy Attune prosthesis.  ATTENDING SURGEON:  Elspeth SAUNDERS. Kay, MD  ASSISTANT:  Debby Crock [place], [place], who was scrubbed during the entire procedure, and necessary for satisfactory completion of surgery.  ANESTHESIA:  Spinal anesthesia plus adductor canal block was utilized.  ESTIMATED BLOOD LOSS:  Minimal.  FLUID REPLACEMENT:  1000 mL crystalloid.  COUNTS:  Instrument count was correct.  COMPLICATIONS:  There were no complications.  ANTIBIOTICS:  Perioperative antibiotics were given.  INDICATIONS:  The patient is an 83 year old female who presents with a history of worsening left knee pain due to end-stage osteoarthritis, bone-on-bone.  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  The patient has  had declining function and worsening pain.  Despite conservative management, desires operative treatment to eliminate pain and to restore function.  Informed consent obtained.  DESCRIPTION OF PROCEDURE:  After an adequate level of spinal anesthesia was achieved, the patient was positioned supine on the operating room table.  The left leg correctly identified.  Nonsterile tourniquet was placed on the left proximal thigh.   Sterile prep and drape were performed.  Timeout called verifying correct patient and correct site.  We then elevated the leg and exsanguinated with an Esmarch bandage, inflating the tourniquet to 250 mmHg.  We placed the knee in flexion and performed a  longitudinal midline incision with a 10 blade scalpel.  We used a fresh 10 blade scalpel  for the medial parapatellar arthrotomy.  We then divided the lateral patellofemoral ligaments and everted the patella.  We flexed the knee and exposed the distal  femur, which was devoid of cartilage.  We entered the distal femur with a step-cut drill.  We irrigated the canal.  We then placed an intramedullary guide and resected 11 mm off the distal femur set on 5 degrees of valgus.  Once we had a distal femoral  cut done, we sized the femur to a size 6 anterior down, performed anterior, posterior, and chamfer cuts with a 4-in-1 block.  Next, we removed ACL, PCL, and meniscal tissues and subluxed the tibia anteriorly.  We then cut the tibia using external jig 2  mm off the affected medial side.  There was deep posteromedial wear, which required a fairly significant resection laterally, but we were 90 degrees perpendicular to the long axis of the tibia with minimal posterior slope.  Once we had our tibial cut  done, we used a laminar spreader and removed posterior femoral condyle osteophytes.  We injected the posterior capsule with a combination of Marcaine, Exparel and saline.  We then checked our gaps, which were symmetric at greater than 6 mm.  We felt like  we could probably get 8 in there.  We then went ahead and removed the pins from the tibia.  We went ahead and completed the tibia preparation with a modular jig and keel punch for the 6 tibia.  Next, we went to the femur and cut the box for the 6 narrow  femur.  Once that was done, we impacted the femoral trial in place and drilled lug holes and then went to the patella.  We  went from a 27-mm thickness down to a 17 mm thickness with the oscillating saw.  We drilled lug holes for the 38 patellar button.   We placed the button in place and ranged the knee.  There was a little bit of lateral subluxation of the patella.  Thus, we went ahead and did a lateral release, which realigned the patella within the trochlea and that with the flexion and extension,   the patella was located appropriately with no touch technique.  We removed all trial components.  We irrigated thoroughly.  We vacuum mixed high viscosity cement.  We dried the bone well and then cemented the components into place, 6 tibia, 6 left narrow  femur, and we used a size 6  8 mm poly and placed that in the knee and placed the knee in extension to compress the cement well while the cement set up.  We also used a patellar clamp for the patella.  Once all cement was hard on the back table, we  removed excess cement with a quarter inch curved osteotome inspecting the posterior aspect of the knee as well.  We trialed with the 8 and decided to go with the size 6 12 mm poly.  Once we had the anterior capsule injected with a combination of  Marcaine, Exparel and saline, we placed the final poly in place, which was a size 6, 12 mm, placed on the tibial tray and reduced the knee.  Nice little pop as that knee reduced, nice and stable throughout a full arc of motion with normal patellar  tracking.  We irrigated again and then closed the parapatellar arthrotomy with #1 Vicryl suture followed by 2-0 Vicryl and 0 Vicryl layered subcutaneous closure and a running 4-0 Monocryl for skin.  Steri-Strips were applied followed by a sterile  dressing.  The patient tolerated surgery well.   PUS D: 01/02/2024 1:11:41 pm T: 01/02/2024 6:31:00 pm  JOB: [id number]/ [id number]

## 2024-01-02 NOTE — Anesthesia Procedure Notes (Signed)
 Anesthesia Regional Block: Adductor canal block   Pre-Anesthetic Checklist: , timeout performed,  Correct Patient, Correct Site, Correct Laterality,  Correct Procedure, Correct Position, site marked,  Risks and benefits discussed,  Surgical consent,  Pre-op evaluation,  At surgeon's request and post-op pain management  Laterality: Left  Prep: Maximum Sterile Barrier Precautions used, chloraprep       Needles:  Injection technique: Single-shot  Needle Type: Echogenic Stimulator Needle     Needle Length: 9cm  Needle Gauge: 22     Additional Needles:   Procedures:,,,, ultrasound used (permanent image in chart),,    Narrative:  Start time: 01/02/2024 9:50 AM End time: 01/02/2024 9:55 AM Injection made incrementally with aspirations every 5 mL.  Performed by: Personally  Anesthesiologist: Merla Almarie HERO, DO  Additional Notes: Monitors applied. No increased pain on injection. No increased resistance to injection. Injection made in 5cc increments. Good needle visualization. Patient tolerated procedure well.

## 2024-01-02 NOTE — Anesthesia Procedure Notes (Signed)
 Spinal  Patient location during procedure: OR Start time: 01/02/2024 11:00 AM End time: 01/02/2024 11:06 AM Reason for block: surgical anesthesia Staffing Performed: anesthesiologist  Anesthesiologist: Merla Almarie HERO, DO Performed by: Merla Almarie HERO, DO Authorized by: Merla Almarie HERO, DO   Preanesthetic Checklist Completed: patient identified, IV checked, risks and benefits discussed, surgical consent, monitors and equipment checked, pre-op evaluation and timeout performed Spinal Block Patient position: sitting Prep: DuraPrep and site prepped and draped Patient monitoring: cardiac monitor, continuous pulse ox and blood pressure Approach: midline Location: L4-5 Injection technique: single-shot Needle Needle type: Pencan  Needle gauge: 24 G Needle length: 9 cm Assessment Sensory level: T6 Events: CSF return and second provider Additional Notes Functioning IV was confirmed and monitors were applied. Sterile prep and drape, including hand hygiene and sterile gloves were used. The patient was positioned and the spine was prepped. The skin was anesthetized with lidocaine .  Free flow of clear CSF was obtained prior to injecting local anesthetic into the CSF.  The spinal needle aspirated freely following injection.  The needle was carefully withdrawn.  The patient tolerated the procedure well.

## 2024-01-03 DIAGNOSIS — M1712 Unilateral primary osteoarthritis, left knee: Secondary | ICD-10-CM | POA: Diagnosis not present
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
## 2024-01-05 ENCOUNTER — Encounter (HOSPITAL_COMMUNITY): Payer: Self-pay | Admitting: Orthopedic Surgery
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
